# Patient Record
Sex: Female | Born: 1965
Health system: Southern US, Community
[De-identification: ages and names within clinical notes are randomized; demographics above are authoritative.]

## PROBLEM LIST (undated history)

## (undated) DIAGNOSIS — Z62810 Personal history of physical and sexual abuse in childhood: Secondary | ICD-10-CM

## (undated) DIAGNOSIS — Z5189 Encounter for other specified aftercare: Secondary | ICD-10-CM

## (undated) DIAGNOSIS — I10 Essential (primary) hypertension: Secondary | ICD-10-CM

## (undated) DIAGNOSIS — F909 Attention-deficit hyperactivity disorder, unspecified type: Secondary | ICD-10-CM

## (undated) DIAGNOSIS — A4902 Methicillin resistant Staphylococcus aureus infection, unspecified site: Secondary | ICD-10-CM

## (undated) DIAGNOSIS — N809 Endometriosis, unspecified: Secondary | ICD-10-CM

## (undated) DIAGNOSIS — M1990 Unspecified osteoarthritis, unspecified site: Secondary | ICD-10-CM

## (undated) DIAGNOSIS — R000 Tachycardia, unspecified: Secondary | ICD-10-CM

## (undated) DIAGNOSIS — F4310 Post-traumatic stress disorder, unspecified: Secondary | ICD-10-CM

## (undated) DIAGNOSIS — M329 Systemic lupus erythematosus, unspecified: Secondary | ICD-10-CM

## (undated) DIAGNOSIS — J439 Emphysema, unspecified: Secondary | ICD-10-CM

## (undated) DIAGNOSIS — R569 Unspecified convulsions: Secondary | ICD-10-CM

## (undated) DIAGNOSIS — I2510 Atherosclerotic heart disease of native coronary artery without angina pectoris: Secondary | ICD-10-CM

## (undated) DIAGNOSIS — A048 Other specified bacterial intestinal infections: Secondary | ICD-10-CM

## (undated) DIAGNOSIS — C5700 Malignant neoplasm of unspecified fallopian tube: Secondary | ICD-10-CM

## (undated) DIAGNOSIS — M069 Rheumatoid arthritis, unspecified: Secondary | ICD-10-CM

## (undated) HISTORY — DX: Atherosclerotic heart disease of native coronary artery without angina pectoris: I25.10

## (undated) HISTORY — DX: Emphysema, unspecified: J43.9

## (undated) HISTORY — DX: Personal history of physical and sexual abuse in childhood: Z62.810

## (undated) HISTORY — PX: HYSTEROSCOPY W/ ENDOMETRIAL ABLATION: SUR665

## (undated) HISTORY — PX: OTHER SURGICAL HISTORY: SHX169

## (undated) HISTORY — DX: Post-traumatic stress disorder, unspecified: F43.10

## (undated) HISTORY — DX: Other specified bacterial intestinal infections: A04.8

## (undated) HISTORY — DX: Tachycardia, unspecified: R00.0

## (undated) HISTORY — DX: Attention-deficit hyperactivity disorder, unspecified type: F90.9

## (undated) HISTORY — DX: Malignant neoplasm of unspecified fallopian tube: C57.00

## (undated) HISTORY — PX: COLONOSCOPY: SHX174

## (undated) HISTORY — PX: DILATION AND EVACUATION: SHX1459

## (undated) HISTORY — PX: LYMPHADENECTOMY: SHX15

---
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
## 1997-10-30 ENCOUNTER — Encounter: Payer: Self-pay | Admitting: Emergency Medicine

## 1997-10-30 ENCOUNTER — Inpatient Hospital Stay (HOSPITAL_COMMUNITY): Admission: EM | Admit: 1997-10-30 | Discharge: 1997-11-01 | Payer: Self-pay | Admitting: Emergency Medicine

## 1997-11-01 ENCOUNTER — Encounter: Payer: Self-pay | Admitting: Gastroenterology

## 1998-01-02 HISTORY — PX: AUGMENTATION MAMMAPLASTY: SUR837

## 1999-01-04 ENCOUNTER — Other Ambulatory Visit: Admission: RE | Admit: 1999-01-04 | Discharge: 1999-01-04 | Payer: Self-pay | Admitting: Obstetrics and Gynecology

## 1999-12-23 ENCOUNTER — Emergency Department (HOSPITAL_COMMUNITY): Admission: EM | Admit: 1999-12-23 | Discharge: 1999-12-23 | Payer: Self-pay | Admitting: Emergency Medicine

## 1999-12-24 ENCOUNTER — Encounter: Payer: Self-pay | Admitting: Emergency Medicine

## 2000-01-03 ENCOUNTER — Emergency Department (HOSPITAL_COMMUNITY): Admission: EM | Admit: 2000-01-03 | Discharge: 2000-01-03 | Payer: Self-pay | Admitting: Emergency Medicine

## 2000-01-04 ENCOUNTER — Inpatient Hospital Stay (HOSPITAL_COMMUNITY): Admission: RE | Admit: 2000-01-04 | Discharge: 2000-01-07 | Payer: Self-pay | Admitting: Family Medicine

## 2000-03-19 ENCOUNTER — Emergency Department (HOSPITAL_COMMUNITY): Admission: EM | Admit: 2000-03-19 | Discharge: 2000-03-19 | Payer: Self-pay | Admitting: Emergency Medicine

## 2001-08-19 ENCOUNTER — Emergency Department (HOSPITAL_COMMUNITY): Admission: EM | Admit: 2001-08-19 | Discharge: 2001-08-20 | Payer: Self-pay | Admitting: Emergency Medicine

## 2001-08-19 ENCOUNTER — Encounter: Payer: Self-pay | Admitting: Emergency Medicine

## 2001-09-04 ENCOUNTER — Emergency Department (HOSPITAL_COMMUNITY): Admission: EM | Admit: 2001-09-04 | Discharge: 2001-09-04 | Payer: Self-pay | Admitting: Emergency Medicine

## 2001-09-04 ENCOUNTER — Encounter: Payer: Self-pay | Admitting: Emergency Medicine

## 2001-10-09 ENCOUNTER — Emergency Department (HOSPITAL_COMMUNITY): Admission: EM | Admit: 2001-10-09 | Discharge: 2001-10-09 | Payer: Self-pay | Admitting: Emergency Medicine

## 2001-10-09 ENCOUNTER — Encounter: Payer: Self-pay | Admitting: Emergency Medicine

## 2002-08-07 ENCOUNTER — Encounter: Payer: Self-pay | Admitting: Emergency Medicine

## 2002-08-07 ENCOUNTER — Encounter: Payer: Self-pay | Admitting: Internal Medicine

## 2002-08-07 ENCOUNTER — Inpatient Hospital Stay (HOSPITAL_COMMUNITY): Admission: EM | Admit: 2002-08-07 | Discharge: 2002-08-09 | Payer: Self-pay | Admitting: Emergency Medicine

## 2002-11-27 ENCOUNTER — Emergency Department (HOSPITAL_COMMUNITY): Admission: EM | Admit: 2002-11-27 | Discharge: 2002-11-28 | Payer: Self-pay | Admitting: Emergency Medicine

## 2002-11-30 ENCOUNTER — Inpatient Hospital Stay (HOSPITAL_COMMUNITY): Admission: AD | Admit: 2002-11-30 | Discharge: 2002-11-30 | Payer: Self-pay | Admitting: Family Medicine

## 2002-12-02 ENCOUNTER — Encounter: Admission: RE | Admit: 2002-12-02 | Discharge: 2002-12-02 | Payer: Self-pay | Admitting: Obstetrics and Gynecology

## 2002-12-17 ENCOUNTER — Ambulatory Visit (HOSPITAL_COMMUNITY): Admission: RE | Admit: 2002-12-17 | Discharge: 2002-12-17 | Payer: Self-pay | Admitting: Obstetrics and Gynecology

## 2002-12-30 ENCOUNTER — Encounter: Admission: RE | Admit: 2002-12-30 | Discharge: 2002-12-30 | Payer: Self-pay | Admitting: Obstetrics and Gynecology

## 2003-01-03 HISTORY — PX: SALPINGECTOMY: SHX328

## 2003-01-05 ENCOUNTER — Encounter (INDEPENDENT_AMBULATORY_CARE_PROVIDER_SITE_OTHER): Payer: Self-pay | Admitting: Specialist

## 2003-01-05 ENCOUNTER — Inpatient Hospital Stay (HOSPITAL_COMMUNITY): Admission: RE | Admit: 2003-01-05 | Discharge: 2003-01-07 | Payer: Self-pay | Admitting: Obstetrics & Gynecology

## 2003-07-26 ENCOUNTER — Inpatient Hospital Stay (HOSPITAL_COMMUNITY): Admission: EM | Admit: 2003-07-26 | Discharge: 2003-08-01 | Payer: Self-pay | Admitting: Emergency Medicine

## 2003-07-28 ENCOUNTER — Encounter: Payer: Self-pay | Admitting: Cardiovascular Disease

## 2004-03-17 ENCOUNTER — Ambulatory Visit: Payer: Self-pay | Admitting: Obstetrics and Gynecology

## 2005-06-12 ENCOUNTER — Inpatient Hospital Stay (HOSPITAL_COMMUNITY): Admission: EM | Admit: 2005-06-12 | Discharge: 2005-06-16 | Payer: Self-pay | Admitting: Emergency Medicine

## 2006-06-22 ENCOUNTER — Emergency Department (HOSPITAL_COMMUNITY): Admission: EM | Admit: 2006-06-22 | Discharge: 2006-06-22 | Payer: Self-pay | Admitting: Emergency Medicine

## 2007-06-13 ENCOUNTER — Inpatient Hospital Stay (HOSPITAL_COMMUNITY): Admission: EM | Admit: 2007-06-13 | Discharge: 2007-06-17 | Payer: Self-pay | Admitting: Emergency Medicine

## 2007-06-14 ENCOUNTER — Encounter (INDEPENDENT_AMBULATORY_CARE_PROVIDER_SITE_OTHER): Payer: Self-pay | Admitting: Gastroenterology

## 2007-08-09 ENCOUNTER — Emergency Department (HOSPITAL_COMMUNITY): Admission: EM | Admit: 2007-08-09 | Discharge: 2007-08-10 | Payer: Self-pay | Admitting: Emergency Medicine

## 2007-08-18 ENCOUNTER — Inpatient Hospital Stay (HOSPITAL_COMMUNITY): Admission: EM | Admit: 2007-08-18 | Discharge: 2007-08-19 | Payer: Self-pay | Admitting: Emergency Medicine

## 2010-05-17 NOTE — Consult Note (Signed)
[name], KNACK NO.:  [id number]   MEDICAL RECORD NO.:  [id number]          PATIENT TYPE:  INP   LOCATION:  2630                         FACILITY:  [facility]   PHYSICIAN:  Antonietta Breach, M.D.  DATE OF BIRTH:  06/17/1965   DATE OF CONSULTATION:  06/17/2007  DATE OF DISCHARGE:                                 CONSULTATION   Mrs. Saltsman does not have a recollection for 3 days ago.  Since that  time, the history both acute and some of the past history has been  organized and recalled with the benefit of the patient's husband.   The patient's husband went home and reviewed all of the medication  supply and he confirms that it was impossible for the patient to have  taken more than a few extra pills.   The patient explains that she does recall taking a few extra pills in  order to help her relax and sleep, but she completely denies having any  suicidal intent.  Also, the history that is given by both the patient  and the husband confirms that the patient was having normal interests  and constructive goals all the way up to her acute admission.   The husband explained this is why he was so surprised to hear the report  that she had overdosed on medication.   While the patient requested and gave permission for her husband to be in  the room, the undersigned provided extensive discussion and education  with both the patient and her husband on treatment of the patient's  psychiatric condition.   The patient continues to be socially appropriate and cooperative.  She  describes constructive future goals and interests.  She is not having  any hallucinations or delusions.   PAST PSYCHIATRIC HISTORY UPDATE:  Please see the recent dictation.  The  patient does confirm that she has been taking a total of 8 mg per day of  either Atacand or Xanax.  She has been proceeding through the Atacand  detox protocol well in the hospital.  Both she and her husband concur  that it  would be best for her to have a reduction of the benzodiazepines  that she is using.   PAST PSYCHIATRIC HISTORY:  The patient states that she was recently  placed on Zoloft 100, the dosage was increased to 150 mg daily.   REVIEW OF SYSTEMS:  RHEUMATOLOGIC:  The patient explains that she had  lupus for years and has arthritic pain.   VITAL SIGNS:  Temperature 98.9, pulse 91, respiratory rate 14, blood  pressure 120/87, O2 saturation on room air 100%.   MENTAL STATUS EXAM:  Mrs. Bruns is alert.  She is oriented to all  spheres.  Her concentration is normal.  Her memory is intact for  immediate, recent and remote except for the 2 days, one being the day  prior to admission and the second day being the day after admission.  Her fund of knowledge and intelligence are within normal limits.  Speech  involves normal rate and prosody.  Thought process is logical, coherent,  goal-directed.  No looseness of associations.  Thought content, no  thoughts of harming herself, no thoughts of harming others no delusions,  no hallucinations.  Affect is broad and appropriate.  Mood is within  normal limits.  Insight is intact.  Judgment is intact.   ASSESSMENT:  1. 293.83 mood disorder not otherwise specified.  2. 293.84 anxiety disorder not otherwise specified.   The patient is no longer at risk to harm herself or others.  She agrees  to call emergency services immediately for any thoughts of harming  herself, thoughts of harming others or distress.   She declines admission to a psychiatric hospital and is no longer  committable after recovering from her acute mental status changes.   She wants to go to an intensive outpatient psychiatric program and is  psychiatrically cleared to do so.  The indications, alternatives and  adverse effects of her psychotropic medications were reviewed.   She understands the risk of over-using controlled substances and has a  new resolved to use less controlled  substances including medication for  pain.  She realizes that if she utilizes the controlled substances too  much that she will create a mental condition from them that can be worse  than the pain condition itself.   RECOMMENDATIONS:  Would ask the social worker to set this patient up  with an intensive outpatient psychiatric program to start immediately  after discharge.  Programs are available at [facility], [facility], [place], [place] and another [facility].   The patient will continue on her Topamax 25 mg b.i.d. she will restart  her Zoloft at 50 mg daily and then titrate to 150 mg daily over the next  week.   Regarding benzodiazepines, she will utilize only Xanax and will take 1  mg one half to one p.o. t.i.d. p.r.n. acute anxiety. she agrees to not  drive if drowsy.   The undersigned also recommended that she undergo a force of cognitive  behavioral therapy with deep breathing and progressive muscle  relaxation.  This will allow her, over the long-term, to reduce and  potentially eliminate her need for benzodiazepine therapy, particularly  if she stays on the Zoloft for 16 weeks.   The patient is continuing on Topamax as her primary mood stabilizer.  It  appears that she does have some form of bipolar disorder; however, as  mentioned above, the diagnosis 293.83 is utilized because of the general  medical factors involved in her decreased energy.   TOTAL TIME SPENT:  35 minutes.      Antonietta Breach, M.D.  Electronically Signed     JW/MEDQ  D:  06/17/2007  T:  06/17/2007  Job:  [id number]

## 2010-05-17 NOTE — Consult Note (Signed)
NAMELAKARA, WEILAND                 ACCOUNT NO.:  [id number]   MEDICAL RECORD NO.:  [id number]          PATIENT TYPE:  INP   LOCATION:  2630                         FACILITY:  [facility]   PHYSICIAN:  Antonietta Breach, M.D.  DATE OF BIRTH:  [birth year]   DATE OF CONSULTATION:  06/14/2007  DATE OF DISCHARGE:                                 CONSULTATION   REQUESTING PHYSICIAN:  [name], M.D.   REASON FOR CONSULTATION:  Suicide attempt with depression.   HISTORY OF PRESENT ILLNESS:  Ms. Cowles is a 45 year old female admitted  to the [facility] on June 13, 2007 due to a suicide attempt with  overdose.   The patient has been experiencing depressed mood, anhedonia, poor  concentration for several weeks.  She has experienced the loss of  several family members and severe grief.  She acknowledges suicidal  thoughts with an intention to overdose and did overdose with two bottles  of meclizine.   PAST PSYCHIATRIC HISTORY:  The patient has a diagnosis of bipolar  disorder and has been treated with the mood stabilizer regimen of  Topamax at 25 mg b.i.d.  She also has a history of severe anxiety and  has been taking Xanax approximately 6 mg a day.  She reported to the  admitting physician that she takes 8 mg of Ativan to sleep at night.  She is asking the undersigned for 3 mg of Ativan for insomnia.   The patient does have a history of prior suicide attempt.  Also, the  family reported that the patient sometimes mixes her medications with  alcohol.   FAMILY PSYCHIATRIC HISTORY:  None known.   SOCIAL HISTORY:  The patient has been suffering grief.  She has lost a  number of family members over the past 2 years.  She had a positive  urine drug screen for benzodiazepines.  She had negative alcohol and  tricyclic level.  The patient is married.   PAST MEDICAL HISTORY:  1. History of lupus.  2. Rheumatoid arthritis.  3. Migraines.   MEDICATIONS:  MAR is reviewed.   REVIEW OF  SYSTEMS:  Noncontributory.   MENTAL STATUS EXAM:  Ms. Chandonnet is alert.  She is oriented to all  spheres.  Speech normal.  Affect constricted with tears.  Mood  depressed.  Thought content as in the history of present illness.  Thought process is coherent.  Judgment is impaired.  Insight is poor  except for the patient's awareness that she needs treatment for her  depression.   The patient's memory function is intact.   ASSESSMENT:  AXIS I:  296.80 bipolar disorder not otherwise specified,  depressed.  Sedative hypnotic dependence.  AXIS II:  Deferred.  AXIS III:  See past medical history.  AXIS IV:  Grief.  AXIS V:  30.   RECOMMENDATIONS:  1. Would continue the sitter for suicide precautions.  2. Would admit to a psychiatric ward as soon as possible.  3. Restart the patient's Topamax 25 mg b.i.d.  4. Discontinue Xanax and start the Atacand detox protocol.  5. Other psychotropic medications  deferred.      Antonietta Breach, M.D.  Electronically Signed     JW/MEDQ  D:  06/14/2007  T:  06/14/2007  Job:  [id number]

## 2010-05-17 NOTE — H&P (Signed)
[name], [name]                 ACCOUNT NO.:  [id number]   MEDICAL RECORD NO.:  [id number]          PATIENT TYPE:  EMS   LOCATION:  MAJO                         FACILITY:  [facility]   PHYSICIAN:  Hollice Espy, M.D.DATE OF BIRTH:  March 13, 1965   DATE OF ADMISSION:  06/13/2007  DATE OF DISCHARGE:                              HISTORY & PHYSICAL   PRIMARY CARE PHYSICIAN:  C. Duane Lope, M.D.   RHEUMATOLOGIST:  Demetria Pore. Coral Spikes, M.D.   CONSULTANTS:  Psychiatry.   CHIEF COMPLAINT:  Overdose.   HISTORY OF PRESENT ILLNESS:  The patient is 45 year old white female  with past medical history of lupus as well as anxiety disorder, who is  on a large high dose of Soma as well as anxiolytics, who the husband  reports that she started having some episodes of diarrhea this evening  around dinnertime and felt very weak and lightheaded.  She was in the  bathroom for some time, and at that time reported took her usual dose of  anxiety medicines to help her sleep, when in the middle of the night he  noted all of a sudden that she seemed to be gasping for air.  He became  concerned and had the patient brought into the emergency room.  In the  emergency room, the patient had labs drawn.  She was found to have  normal cardiac markers and normal D-dimer.  Her blood pressure was noted  to be on the low end, with a blood pressure of 82/51, with a heart rate  is 64.  She initially required 100% face mask, but this was able to be  weaned down, and she is currently saturating 99% on room air.  The  patient was given Narcan, which did little to wake her up, but she  started to improve on her own.  Her urine drug screen came back positive  for benzodiazepines, consistent with her anxiolytic medications, but  otherwise negative.  The rest of her labs were essentially unremarkable,  with the exception of a BUN of 24, a creatinine of 1.6, and a white  count of 11, with no shift.  Her alcohol and tricyclic  level on her labs  were unremarkable.  I suspect at this point then that the patient likely  had a benzodiazepine overdose, possibly accidental.  When I went to  speak after evaluating the patient, she was starting to wake up but was  still extremely drowsy and unable to tell me anything.  No review of  systems was able to be obtained.  Her brothers spoke with me outside the  room and told me they had some concerns of the patient not being  mentally fit.  They said that she has had a previous history of a  suicide attempt and that she does not take her medications consistently,  oftentimes taking more than the required dose.  They also said that  sometimes she mixes her medications with alcohol, although at this time  it was noted that her alcohol level was normal.  They had some concerns  about the patient's  safety, and because of the patient's inability to  tell me anything, we are going to treat her as a possible suicidal  attempt.   PAST MEDICAL HISTORY:  1. History of lupus.  2. Rheumatoid arthritis.  3. Migraines.  4. Anxiety.   MEDICATIONS:  1. Phenergan 12.5 mg p.o. 4 times a day p.r.n., which according to the      patient's notes she usually is taking 4 times a.  2. She is also on Topamax 2 times a day.  3. Lasix p.o. daily.  4. K-Dur 20 mg p.o. daily when taking Lasix.  5. She takes Xanax, I believe to be 1 mg 4 times a day scheduled.  6. She takes p.r.n., meclizine.  7. She takes Soma 350 mg, she looks to be taking 4 times a day, 1 the      morning, 1 at lunch, and then 2 at night.  8. She also takes lorazepam 8 mg at night for a sleeping disorder.  9. She has also a mention of her medications that she takes Soma 2 at      night, which is listed twice on her medicine sheet.  It is unclear      if she is taking another additional 2 more, or if this is the      previously mentioned dose.   She has reportedly no known drug allergies.   SOCIAL HISTORY:  I am unable to  get any kind of tobacco, alcohol, or  drug use history.   FAMILY HISTORY:  Noncontributory.   PHYSICAL EXAMINATION:  VITAL SIGNS ON ADMISSION:  Temperature 98, heart  rate 64, blood pressure 82/51, up to 92/63, respirations 17, up to 21,  O2 saturation now 99% on room air.  GENERAL:  The patient is somnolent.  She is unable to get any kind of  oriented status.  HEENT:  Normocephalic, atraumatic.  Her mucous membranes are slightly  dry.  Her pupils are slightly dilated, but respond to light.  HEART:  Regular rate and rhythm, S1, S2.  LUNGS:  Clear to auscultation bilaterally.  ABDOMEN:  Soft, nontender, nondistended.  Positive bowel sounds.  She  does have ongoing current diarrhea.  EXTREMITIES:  Show no clubbing, cyanosis, or edema.   LABORATORY WORK:  Salicylate level less than 4.  Tylenol level less than  10.  Sodium 136, potassium 4.4, chloride 106, bicarbonate 19.  The  patient has an anion gap of 11.  BUN 24, creatinine 1.6, glucose 90.  LFTs essentially unremarkable.  White count 11, with 86% neutrophils,  H&H 14 and 42, MCV of 99, platelet count 198.  Urine pregnancy test is  negative.  Urine drug screen is positive for benzodiazepines.  Alcohol  level less than 5.  Tricyclics:  None detected.  D-dimer is 0.26, in the  normal range.  CPK 71.6, MB 1.2, troponin-I less than 0.05.   ASSESSMENT AND PLAN:  1. Benzodiazepine overdose, ? intentional versus accidental, +/- acute      renal failure, leading to decreased clearance.  N.p.o., 24-hour      sitter. step-down unit, psych evaluation.  Needs long-term scaling      of her medications.  I am concerned about the possibility of the      patient's high doses of benzodiazepines.  I would recommended she      be discontinued from Phenergan altogether, taking p.r.n. Zofran for      nausea.  In addition, as far as her anxiolytics go,  she looks to be      on a high dose of Xanax 4 times a day, plus an additional 8 mg of       lorazepam at night.  Would recommend considering changing her      nightly dose of lorazepam to Ambien, and her daytime Xanax to be      rather than prescribed through her PCP, but perhaps through      psychiatry.  In addition, for her pain medications, she is high      dose of Soma.  I would consider the possibility of a pain      specialist, but again will defer to her PCP for these issues.  In      the meantime, because of her previous report of a suicidal attempt,      we have not able to determine whether that is what has occurred      now.  We will keep her on 24-hour suicidal precautions and ask      psychiatry to see.  2. Diarrhea.  Suspect a viral gastroenteritis.  We will continue to      monitor.  Confirm that she is not on any      antibiotics recently.  3. Acute renal failure secondary to diarrhea.  Treat with IV fluids.  4. History of lupus.  We will need to confirm her medications.  She is      currently not on steroids.Hollice Espy, M.D.  Electronically Signed     SKK/MEDQ  D:  06/13/2007  T:  06/13/2007  Job:  [id number]   cc:   C. Duane Lope, M.D.  Anselm Jungling, MD  Demetria Pore. Coral Spikes, M.D.

## 2010-05-17 NOTE — Consult Note (Signed)
[name], [name] NO.:  [id number]   MEDICAL RECORD NO.:  [id number]          PATIENT TYPE:  INP   LOCATION:  2630                         FACILITY:  [facility]   PHYSICIAN:  Antonietta Breach, M.D.  DATE OF BIRTH:  12/09/1965   DATE OF CONSULTATION:  06/14/2007  DATE OF DISCHARGE:                                 CONSULTATION   ADDENDUM:  This is an addendum to an inpatient consultation report.   REQUESTING PHYSICIAN:  [name], M.D.   ALLERGIES:  PENICILLIN, DARVOCET, PLAQUENIL, LATEX.   MEDICATIONS:  The patient is not on any current psychotropic  medications.  [facility] is reviewed.   OTHER REVIEW OF SYSTEMS:  Constitutional, head, eyes, ears, nose,  throat, mouth, neurologic, psychiatric, cardiovascular, respiratory,  gastrointestinal, genitourinary, skin, musculoskeletal, hematologic,  lymphatic, endocrine, metabolic all unremarkable.      Antonietta Breach, M.D.  Electronically Signed     JW/MEDQ  D:  06/17/2007  T:  06/17/2007  Job:  [id number]

## 2010-05-17 NOTE — Discharge Summary (Signed)
[name], [name]                 ACCOUNT NO.:  [id number]   MEDICAL RECORD NO.:  [id number]          PATIENT TYPE:  INP   LOCATION:  1442                         FACILITY:  [facility]   PHYSICIAN:  Corinna L. Lendell Caprice, MDDATE OF BIRTH:  01-Mar-1965   DATE OF ADMISSION:  08/18/2007  DATE OF DISCHARGE:  08/19/2007                               DISCHARGE SUMMARY   DISCHARGE DIAGNOSES:  1. Chest pain, myocardial infarction and pulmonary embolus ruled out      by serial cardiac enzymes and D-dimer.  2. History of cocaine use within the past week.  3. Reported history of lupus and/or rheumatoid arthritis, results      unavailable.  4. Chronic chest, abdominal and pelvic pain.  5. Irritable bowel syndrome.  6. Anxiety.  7. Bipolar disorder.  8. History of multiple intentional and unintentional medication      overdoses.  9. History of candidal esophagitis.  10.Anemia.   DISCHARGE MEDICATIONS:  1. Dilaudid 2 mg every 4 hours as needed for pain.  2. She is to continue Tylenol as needed.  3. Minocycline 100 mg daily.  4. Tresa Garter is supposed to be twice a day as needed.  5. Furosemide as needed.  6. Prenatal vitamin.  7. Klor-Con 20 mEq a day.  8. She is supposed to have stopped lorazepam, although she is still      taking this.  She is supposed to instead be on Xanax 0.5 to 1 mg      p.o. t.i.d. p.r.n. panic only.  9. Promethazine 25 mg every 6 hours as needed.  10.Zoloft 150 mg a day.  11.Topamax 25 mg a day.  12.Meclizine 25 mg as needed for dizziness.   She is encouraged to follow up with [name] in 3 weeks after abstaining  from cocaine and other drugs of abuse.  If she still has chest pain at  that time, she can be referred to cardiology as an outpatient.  She is  encouraged to follow up with rheumatologist of choice first available  appointment.  She has complement levels pending as well as an anti-  double-stranded DNA antibody.   CONDITION:  Stable.   ACTIVITY:  Ad-lib.   PROCEDURES:  None.   LABORATORY DATA:  CBC is unremarkable.  Serial cardiac enzymes normal.  In and out catheter urinalysis:  Negative for nitrites or leukocyte  esterase, small blood, 0 to 2 red cells.  Complete metabolic panel  normal.  D-dimer normal.  Urine pregnancy negative.  C3 low at 60, C4  low at 13.  Urine drug screen:  Positive for opiates and  benzodiazepines.  On August 7, her urine drug screen was positive for  benzodiazepines and cocaine.  Blood alcohol less than 5.  Blood alcohol  on August 7 was 101.   SPECIAL STUDIES/RADIOLOGY:  EKG shows normal sinus rhythm.  Chest x-ray  shows nothing acute.   HISTORY AND HOSPITAL COURSE:  [name] is a 45 year old white female  with a confusing medical history.  She presented with headache, neck  pain, chest pressure, left arm pain and kidney  pain.  She also had some  diaphoresis and nausea.  She was added admitted and ruled out for MI.  She has a strong family history of early heart disease.  Two of her  brothers have had stents in their 46s.  She does have a history of  chronic chest, abdomen and pelvic pain.  She carries the diagnosis of  rheumatoid arthritis and lupus, although I do not know how this was  diagnosed.  She has not seen Dr. Coral Spikes for many years, and she  reportedly has seizures on Plaquenil, so she is not on any medications  for her lupus.  She has a history of anxiety and bipolar disorder with  multiple intentional and unintentional drug overdoses.  She also was  seen in the emergency room a week ago for a drug overdose.  She at that  time had alcohol and cocaine in her system.  The patient has a history  of candidal esophagitis and gastric ulcers, and so she is not on any  anti-inflammatories.  However, she did get 2 doses of Toradol which  seemed to help her symptoms.  She also was requesting a lot of Dilaudid  and Phenergan, although she was tolerating her diet.  She did have some  chest wall tenderness,  but this did not reproduce the pain she was  experiencing.  The patient had a negative Cardiolite in 2005.  I  discussed the case with [facility] cardiology who referred me to the on-call  cardiologist, Dr. Donnie Aho.  He recommended that since the patient has had  cocaine within the past week that she abstain for several weeks and  follow up with her primary care physician.  If she is still having pain  at that time, she may benefit from an outpatient Cardiolite.  After  confronted with her previous positive urine drug screen, she did admit  to using cocaine over the past week, although she minimized it.  I also  recommended that she follow up with a rheumatologist of choice first  available appointment because some of her pain may be lupus related, but  again I do not have any information with regard to how she was  diagnosed, and so I have not placed her on prednisone or other.   Total time on the day of discharge is 50 minutes. .      Corinna L. Lendell Caprice, MD  Electronically Signed     CLS/MEDQ  D:  08/19/2007  T:  08/19/2007  Job:  [id number]   cc:   C. Duane Lope, M.D.  Fax: [phone]   Demetria Pore. Coral Spikes, M.D.  Fax: [phone]   Shirley Friar, MD  Fax: [phone]

## 2010-05-17 NOTE — H&P (Signed)
NAMEVEDANSHI, MASSARO                 ACCOUNT NO.:  [id number]   MEDICAL RECORD NO.:  [id number]          PATIENT TYPE:  INP   LOCATION:  1442                         FACILITY:  [facility]   PHYSICIAN:  Corinna L. Lendell Caprice, MDDATE OF BIRTH:  [birth year]   DATE OF ADMISSION:  08/18/2007  DATE OF DISCHARGE:                              HISTORY & PHYSICAL   CHIEF COMPLAINT:  Chest pain.   HISTORY OF PRESENT ILLNESS:  Ms. [name] is a 45 year old white female  with multiple medical problems and psychiatric problems who presents  with left-sided chest pain that radiates to her left arm on and off for  the past several days.  It woke her up from sleep tonight.  She  sometimes describes it as sharp, sometimes pressure-like.  She says it  is worse if she lies on her left side.  She has received morphine and  Dilaudid, as well as Phenergan, and is difficult to get any history from  currently.  Much of the history is from old records and from the  husband.  She also had some nausea and diaphoresis.  She has a history  of anxiety and benzodiazepine dependence and misuse requiring  hospitalizations for overdoses in the past.  She had a Cardiolite in  2005 which showed no ischemia.  She has several brothers who have had  coronary artery disease in their 89s.  She does not smoke.  She was  recently in the emergency room with a urine drug screen positive for not  only benzodiazepines but also cocaine.  She also carries the diagnosis  of lupus but had a negative ANA and anti double-stranded DNA antibody  several years ago with normal compliment levels.  She reports that Dr.  Coral Spikes is her rheumatologist, although she has not seen him for a long  time.  She is also complaining of neck pain, headache arm pain, kidney  pain.   PAST MEDICAL HISTORY:  1. Benzodiazepine overdose.  2. Bipolar disorder.  3. Reported history of lupus and rheumatoid arthritis.  4. History of migraines.  5. Probable Candida  esophagitis.  6. Irritable bowel syndrome.   MEDICATIONS:  1. Tylenol 1 tablet every 4 hours.  2. Minocycline 100 mg p.o. b.i.d.  3. Soma 350 mg four times a day as needed.  4. Furosemide 40 mg a day.  5. Prenatal vitamins daily.  6. KCl 20 mEq a day.  7. She reports that she is still taking lorazepam, although her last      discharge summary in July recommends that she not take lorazepam      and instead take Xanax 0.5 to 1 mg p.o. t.i.d. as needed only for      panic.  8. Phenergan 25 mg as needed.  9. Zoloft 150 mg daily.  10.Topamax 25 mg twice a day.   SOCIAL HISTORY:  The patient is married and here with her husband.  She  does drink alcohol.  She reportedly does not smoke.  She previously  denied using recreational drugs, but she did have a urine drug screen  positive for  cocaine.   FAMILY HISTORY:  Her brothers have a history of coronary artery disease  in their 33s.   PAST SURGICAL HISTORY:  1. Breast augmentation.  2. Diagnostic laparoscopy with lysis of adhesions and left      salpingectomy for left hydrosalpinx.   REVIEW OF SYSTEMS:  As above; otherwise negative.   PHYSICAL EXAMINATION:  VITAL SIGNS:  Temperature is 98, blood pressure  123/84, pulse 79, respiratory rate 18, oxygen saturation 94% on room  air.  GENERAL:  The patient is a somnolent white female who is speaking very  quietly and difficult to understand.  HEENT:  Pupils equal, round, reactive to light.  Moist mucous membranes.  NECK:  Supple.  She does have some left-sided neck spasm.  LUNGS:  Clear to auscultation bilaterally without wheezes, rhonchi or  rales.  CARDIOVASCULAR:  Regular rate and rhythm without murmurs, gallops or  rubs.  CHEST:  She does have some chest wall tenderness, but she reports this  is different than the pain she has been experiencing.  UPPER EXTREMITIES:  She has full range of motion in her joints of her  left arm with no tenderness.  ABDOMEN:  Soft, nontender,  nondistended.  GU/RECTAL:  Deferred.  EXTREMITIES:  No clubbing, cyanosis or edema.  No calf tenderness.  NEUROLOGIC:  The patient is sleepy but oriented.  Cranial nerves and  sensorimotor exam are grossly intact.  SKIN:  No rash.  PSYCHIATRIC:  Poor eye contact, flat affect, sleepy.   LABORATORY DATA:  CBC unremarkable.  INR is normal.  Complete metabolic  panel normal.  Two sets of point care enzymes negative.  EKG shows  normal sinus rhythm.  Chest x-ray shows nothing acute.   ASSESSMENT/PLAN:  1. Chest pain, atypical.  The patient will get Toradol and Dilaudid as      needed.  Consider cardiac etiology.  Will give aspirin and cycle      cardiac enzymes.  Also consider pulmonary embolus.  I will check a      D-dimer.  Also consider pleurisy or lupus-related symptoms.  2. Reported history of lupus and rheumatoid arthritis.  I will check      an anti double stranded DNA antibody and compliment levels.  3. Bipolar disorder with history of benzodiazepine misuse.  I will      just give Xanax for now.  4. Irritable bowel syndrome.  5. History of chronic abdominal and chest pain.  6. Anxiety.  7. Recent cocaine use based on urine drug screen.      Corinna L. Lendell Caprice, MD  Electronically Signed     CLS/MEDQ  D:  08/18/2007  T:  08/18/2007  Job:  [id number]   cc:   C. Duane Lope, M.D.  Fax: [phone]   Demetria Pore. Coral Spikes, M.D.  Fax: [phone]   Shirley Friar, MD  Fax: [phone]

## 2010-05-17 NOTE — Consult Note (Signed)
NAMENICOLASA, MILBRATH                 ACCOUNT NO.:  [id number]   MEDICAL RECORD NO.:  [id number]          PATIENT TYPE:  INP   LOCATION:  2630                         FACILITY:  [facility]   PHYSICIAN:  Shirley Friar, MDDATE OF BIRTH:  1965/05/18   DATE OF CONSULTATION:  06/14/2007  DATE OF DISCHARGE:                                 CONSULTATION   REFERRING PHYSICIAN:  [name], M.D.   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  She overdosed  on multiple prescription medications last evening and was brought to the  emergency room after reported respiratory arrest at home.  The patient  reports multiple black stools and diarrhea yesterday, none prior to  that.  She has no heartburn, indigestion, abdominal pain, or anorexia.  She is very anxious, concerned about possibly having to go through a  psychiatric admission in the coming days.  The patient reports that she  has had no food today, but there are multiple bags of chips and pretzels  in the room.  She does have a history of rectal bleeding once in 2002  and again in 2007.  In 2007 she had a colonoscopy by Dr. Herbert Moors  that was normal to the cecum.   PAST MEDICAL HISTORY:  Significant for lupus, rheumatoid arthritis,  migraines, anxiety, depression, chronic fatigue syndrome, panic attacks,  and PTSD.  She has had a history of endometriosis, arthritis and, as I  mentioned, a history of GI bleed in 2002 and 2007.   SURGERIES:  1. Appendectomy.  2. Breast augmentation.  3. Fallopian-tube removal.  4. Laparoscopic lysis of adhesions.   ALLERGIES:  1. PENICILLIN.  2. DARVOCET.  3. PLAQUENIL.  4. LATEX.   MEDICATIONS:  1. Phenergan.  2. Topamax.  3. Lasix.  4. Klor-Con.  5. Alprazolam.  6. Lorazepam.  7. Meclizine.  8. Carisoprodol.   LABORATORIES:  Her hemoglobin is 10.6 and stable.  She was brought in  with a hemoglobin  of approximately 11.  Her white count is 7.8,  platelets 140,000, hematocrit 31.  Her BMET is within normal limits  other than a glucose of 134.  Her BUN yesterday was 24, creatinine 1.58,  but that seems to have come down with hydration.  She has guaiac-  positive stools.   ASSESSMENT:  Dr. Charlott Rakes has seen and examined the patient,  collected history, and reviewed her chart.  He states this is a 45-year-  old female admitted after overdose, currently having black stools and  anemia.  Daily Mobic has probably induced gastritis or possibly an  ulcer.  We will plan for an upper esophagogastroduodenoscopy today and  proton pump inhibitor therapy.   Thanks very much for this consultation.      Stephani Police, [place]      Shirley Friar, MD  Electronically Signed    MLY/MEDQ  D:  06/14/2007  T:  06/15/2007  Job:  [id number]   cc:   Shirley Friar, MD

## 2010-05-17 NOTE — Op Note (Signed)
[name], [name]                 ACCOUNT NO.:  [id number]   MEDICAL RECORD NO.:  [id number]          PATIENT TYPE:  INP   LOCATION:  2630                         FACILITY:  [facility]   PHYSICIAN:  Shirley Friar, MDDATE OF BIRTH:  [birth year]   DATE OF PROCEDURE:  DATE OF DISCHARGE:                               OPERATIVE REPORT   INDICATION:  Gastrointestinal bleed (black-colored diarrhea x1 day prior  to admission).   MEDICINES:  1. Phenergan 25 mg IV.  2. Fentanyl 100 mcg IV.  3. Versed 10 mg IV.   FINDINGS:  Endoscope was inserted through the oropharynx and esophagus  intubated, where there were some scattered white raised plaques in the  mid esophagus extending down close to the GE junction.  Endoscope was  advanced through this area into the stomach where a large amount of  solid food particles were seen in the body and fundus of the stomach  which could not be irrigated or washed away.  Retroflexion was done  which revealed normal cardia, but the fundus was partially obscured by  the food particles.  Endoscope was straightened and advanced down to the  distal portion of the stomach where 2 clean-based ulcers were seen that  were shallow and one linear ulcer was noted which was clean based.  The  distal stomach was also edematous and friable.  Endoscope was advanced  through this area into the duodenal bulb, which revealed some scattered  exudate but no ulcer was seen.  Endoscope was then advanced to the  second portion of duodenum which was diffusely flattened and eroded, and  biopsies were taken of this part of the duodenum.  Endoscope was  withdrawn back into the stomach, and a biopsy was taken of the distal  stomach.  Endoscope was then withdrawn into the esophagus, and 2  biopsies were taken of the esophageal mucosa.   ASSESSMENT:  1. Atrophic-appearing duodenal mucosa, status post biopsies, rule out      celiac sprue.  2. Distal clean-based gastric ulcers, status  post biopsy of      surrounding mucosa to check for Helicobacter pylori.  3. White plaques in esophagus concerning for candida esophagitis,      status post biopsies.   PLAN:  1. Follow-up on pathology.  2. Start Diflucan empirically.  3. May need repeat upper endoscopy in the future if bleeding recurs      since fundus and body not completely visualized due to food      particles.  This may be possible to be done as an outpatient if she      does well, with no further bleeding, since she will need to have      her gastric ulcers reevaluated in 6 to 8 weeks to make sure they      have healed.     Shirley Friar, MD  Electronically Signed    VCS/MEDQ  D:  06/14/2007  T:  06/15/2007  Job:  [id number]

## 2010-05-20 NOTE — Consult Note (Signed)
NAME:  [name], WANT NO.:  [id number]   MEDICAL RECORD NO.:  [id number]          PATIENT TYPE:  INP   LOCATION:  1832                         FACILITY:  [facility]   PHYSICIAN:  Graylin Shiver, M.D.   DATE OF BIRTH:  July 17, 1965   DATE OF CONSULTATION:  06/12/2005  DATE OF DISCHARGE:                                   CONSULTATION   REASON FOR CONSULTATION:  The patient is a 45 year old female who presents  to the emergency room with complaints of rectal bleeding for the past 4  days.  She states she has been passing bright red blood per rectum about  every 3 hours.  Also, she is complaining of lower abdominal pain.  She  states that she had this similar problem about 5 years ago and Dr. Ewing Schlein did  a colonoscopy and found some bleeding area in the colon.  She is no more  specific than that.  I do not see that report on the E chart.   PAST HISTORY:   ALLERGIES:  1.  PLAQUENIL.  2.  DARVOCET.  3.  CODEINE.  4.  PENICILLIN.   MEDICAL PROBLEMS:  1.  History of tachycardia in the past.  2.  Seizures.  3.  Lupus.  4.  Fibromyalgia.  5.  Depression.  6.  Anxiety.  7.  Panic attacks.  8.  Endometriosis.   SURGERIES:  1.  Appendectomy.  2.  Fallopian tube removal.   MEDICATIONS:  1.  Allegra.  2.  Diclofenac sodium.  3.  Flexeril.  4.  Flonase.  5.  Lasix.  6.  Meclizine.  7.  Phenergan.  8.  Prednisone.  9.  Prenatal vitamins.  10. Soma.  11. Toprol XL.  12. Tramadol-acetaminophen.  13. Tylenol.  14. Vicodin.  15. Wellbutrin.   PHYSICAL EXAMINATION:  GENERAL:  She does not appear in any acute distress,  nonicteric.  NECK:  Supple.  HEART:  Regular rhythm.  LUNGS:  Clear.  ABDOMEN:  Bowel sounds normal, soft.  Minimal tenderness in lower abdomen  but no rebound, guarding, or hepatosplenomegaly.  Bed pan noted at the side  of the bed shows a small amount of bright red liquidy stool.  It is very  watery in appearance.   IMPRESSION:  Lower  gastrointestinal bleeding, etiology unclear   PLAN:  Admit to hospital.  Observe.  Check H&Hs.  Will plan colonoscopy in a  couple of days.  I offered it to her tomorrow, but she does not wish to prep  tonight, plus she ate something today.           ______________________________  Graylin Shiver, M.D.     SFG/MEDQ  D:  06/12/2005  T:  06/13/2005  Job:  [id number]   cc:   C. Duane Lope, M.D.  Fax: [phone]

## 2010-05-20 NOTE — Group Therapy Note (Signed)
[name], LAWLER NO.:  [id number]   MEDICAL RECORD NO.:  [id number]          PATIENT TYPE:  WOC   LOCATION:  [facility]                   FACILITY:  [facility]   PHYSICIAN:  Argentina Donovan, MD        DATE OF BIRTH:  1965/06/01   DATE OF SERVICE:  03/17/2004                                    CLINIC NOTE   REASON FOR VISIT:  The patient is a 45 year old white female, 5 feet 6  inches weighing 167 pounds, gravida 1 para 1-0-0-1 with a child 90 years of  age, who has multiple and sundry number of medical problems including lupus  erythematosus and rheumatoid arthritis. Also been told that she has  fibromyalgia and many other significant previous complaints, with a history  of tachycardia, history of depression, anxiety disorder, and has always been  complaining of pelvic pain. She had a laparoscopy done in January a year ago  at which time some pelvic adhesions and a salpingectomy was undertaken.  Other than that, the pelvis looked completely normal according to the  surgeons who did this procedure. The patient continues to complain of  continual pelvic pain with increasing dysmenorrhea starting days before her  period and an increase in the amount of bleeding and menorrhagia, especially  the first 2 days. She seems convinced that if everything was removed as her  mother had that the pain certainly with the periods at least would go away,  and is asking for this in view of a lack of evidence of significant  pathology. In a long discussion with the patient and her husband, who has  the same desire, we have talked about complications and the possibility of  the pain being worse after surgery, but they really desire to have the  uterus and ovaries removed. We have told her that we want her to go visit  the pain clinic, and if they concur that they think that the pain that she  has will be significantly reduced by gynecological surgery and removing her  uterus and ovaries  that we will undertake this surgery for her. She has  agreed to do that, and if she calls we will go ahead and schedule her for  hysterectomy and bilateral salpingo-oophorectomy, try to do it vaginally; if  not, doing it abdominally.   IMPRESSION:  1.  Chronic pelvic pain.  2.  Chronic sundry other maladies.      PR/MEDQ  D:  03/17/2004  T:  03/18/2004  Job:  [id number]

## 2010-05-20 NOTE — H&P (Signed)
[name], [name]                           ACCOUNT NO.:  [id number]   MEDICAL RECORD NO.:  [id number]                   PATIENT TYPE:  INP   LOCATION:  3315                                 FACILITY:  [facility]   PHYSICIAN:  Jackie Plum, M.D.             DATE OF BIRTH:  [birth year]   DATE OF ADMISSION:  08/07/2002  DATE OF DISCHARGE:                                HISTORY & PHYSICAL   ADMISSION DIAGNOSES:  1. Respiratory failure secondary to multiple drug overdose.  2. Multiple drug overdose with possible suicide gesture.  3. Tylenol overdose, rule out toxicity.  4. History of chronic __________  5. History of depression.  6. History of anxiety.  7. History of polyarthritis.  8. History of systemic lupus erythematosus, possibly.  9. History of fibromyalgia and chronic fatigue.  10.      History of panic attacks.  11.      History of __________ dependence.  12.      Questionable history of seizure disorder.  13.      Status post breast augmentation.  14.      Status post appendectomy.  15.      History of rectal bleeding and abdominal pain in 1999 with negative     colonoscopy.  16.      History of allergies to codeine, Darvocet, and penicillin and     intolerance to Plaquenil and prednisone.   HISTORY OF PRESENT ILLNESS:  The patient is a 45 year old Caucasian lady who  was brought to the ED around 11:30 p.m. last night after ingestion of  multiple drugs.  According to the patient, she took about 15 tablets of  Phenergan, 7 tablets of Valium, and about 10-20 tablets of Ativan around  11:30 p.m. last night.  There has been some turmoil in the family with a  disagreement with her son and that might be the reason why she took those  medications.  She was said to be depressed, tearful, and had some suicidal  thoughts and gesture at the time of evaluation at the ED around 1 a.m. this  morning.  She admits to a history of nausea, but denies any shortness of  breath, cough,  chest pain, fever, chills, headache, visual changes,  abdominal pain, vomiting, diarrhea, dysuria, or frequency of micturition.  At the emergency room, normal saline was started.  The ACT team was  consulted for admission to the [facility].  However, we were  called to see the patient for admission on account of decrement in her O2  saturation as low as the low 80s with a respiratory rate of 7 while she was  off oxygen.  I came to see the patient around 8 a.m. this morning.  She is  on oxygen and her respiratory rate is around 18 per minute.  O2 saturation  was around 98% on 2 L of oxygen.   PAST  MEDICAL HISTORY:  Please see the problem list above.   MEDICATIONS:  The patient is on multiple medicines at home.  These include:  Soma, Xanax, Wellbutrin, Vioxx, meclozine, Flexeril, prednisone, and  Phenergan.  The doses of these medications are not very clear at this  moment.   SOCIAL HISTORY:  She is a Associate Professor.  She is married with one older son.  She does not smoke cigarettes.  She uses alcohol occasionally.  Denies any  illicit drug use.   REVIEW OF SYSTEMS:  As in HPI.   PHYSICAL EXAMINATION:  VITAL SIGNS:  The blood pressure at 7:50 a.m. was  noted to be 114/59.  Pulse rate 80.  The respiratory rate was said to be 13  per minute, but during this admission respiratory rate is noted in the HPI.  O2 saturation 98% on room air.  Temperature 98.1 degrees Fahrenheit.  GENERAL APPEARANCE:  She was not in acute cardiopulmonary distress.  She was  tearful.  HEENT:  Normocephalic and atraumatic.  The pupils were equal, round, and  reactive to light.  The oropharynx was moist without any exudates or  erythema.  NECK:  Supple.  No JVD.  No thyromegaly.  LUNGS:  Unremarkable.  CARDIAC:  Regular rate and rhythm without any murmur.  ABDOMEN:  Soft.  Bowel sounds were present.  There were normoactive.  EXTREMITIES:  No edema.  No cyanosis.  Color was normal with warm, dry  skin.   LABORATORY DATA:  LFTs without BMET indicate total bilirubin 0.4, alkaline  phosphatase 45, SGOT 17, SGPT 15, total protein 6.9, and albumin 4.1.  The  acetaminophen level was 115.0.  __________ less than 4.  Drug screen  positive for benzodiazepines and opiates.   ASSESSMENT:  This 45 year old lady with multiple medical problems, including  psychiatric problems, presented with multiple drug ingestion.  This was a  possible suicide gesture (the patient denies suicide attempt during my  evaluation, however, apparently had admitted to this during the EDP  evaluation.  Please see the EDP notes). The patient developed hypoxic  respiratory failure secondary to Synthroid depression from her ingested  medications.   PLAN:  The plan of care is to admit the patient to a stepdown bed for 23-  hour observation.  She is hemodynamically stable.  Her O2 saturations on 2 L  have been running between 98-100%.  Her respiratory rate has been around 13-  18 per minute.  The ACT team has been consulted.  The patient will be seen  by the psychiatric team later on tomorrow and may be cleared for that  psychiatric appointment at this point.  She will receive supportive care,  including antiemetics and IV fluids.  We are also going to monitor her  acetaminophen level.  Her acetaminophen level at four hours was less than  150.  We will check the level at 12 hours and if it is more than 50, then  she will be managed appropriately.                                                Jackie Plum, M.D.    GO/MEDQ  D:  08/07/2002  T:  08/07/2002  Job:  [id number]

## 2010-05-20 NOTE — Group Therapy Note (Signed)
NAME:  [name], [name]                           ACCOUNT NO.:  [id number]   MEDICAL RECORD NO.:  [id number]                   PATIENT TYPE:  OUT   LOCATION:  [facility]                           FACILITY:  [facility]   PHYSICIAN:  Elsie Lincoln, MD                   DATE OF BIRTH:  06-04-1965   DATE OF SERVICE:                                    CLINIC NOTE   CLINIC NOTE:  This is a 45 year old, para 1-0-7-1, LMP November 25, 2002,  who presents for followup from [facility] and the [facility].  The patient  states that for the past 3-4 months during her menses, she has had severe  abdominal pain that has been debilitating.  She was seen, November 27, 2002,  in the hospital and discharged, and seen again on the 28th and discharged  with narcotics.  The patient states that the pain is still present now,  although her menses is over.  Patient states her menstrual cycle has been  regular; however, the initial cycle is shortened to every 21 days.  She has  never been diagnosed with endometriosis in the past.  The patient has  multiple, multiple medical problems.  Her medication list is up to 25  medications.   MOST PERTINENT MEDICAL PROBLEMS:  1. 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  2. Irritable bowel syndrome.  3. Fibromyalgia.  4. Chronic fatigue syndrome.  5. Depression/anxiety.  6. Questionable history of suicide attempt from overdose of narcotics.   PAST SURGICAL HISTORY:  1. Appendectomy as a young child.  2. Supposedly had a cyst removed at this time from her ovary.   GYNECOLOGIC HISTORY:  1. NSV x 78 as a 45 year old.  2. One TOP and six SABs in the past eight years.  3. No history of sexually transmitted diseases.  4. Ovarian cyst at age 79.  5. No fibroids and no abnormal Pap smears; however, her last Pap smear was 2-     4 years ago.   SOCIAL HISTORY:  Depression and anxiety.  Married 14 years to a husband who  is present in the room.   REVIEW OF SYMPTOMS:   Positive nausea, vomiting, takes Phenergan for this.  Diarrhea that is controlled with Zelnorm.  No night sweats.  No chest pain,  shortness of breath.  Patient had problem urinating until she decreased her  oxybutynin to one time a day, and now she is able to urinate as usual.  Her  family practice doctor put her on this medication secondary to nocturia.   PHYSICAL EXAMINATION:  VITAL SIGNS:  Today temperature 97.3, pulse 102,  blood pressure 132/87, weight 154.6.  ABDOMEN:  Soft, mildly tender in the lower quadrants, nondistended.  No  rebound, no guarding.  PELVIC:  External genitalia:  Tanner V.  No lesions on the outside.  Vagina  pink, normal rugae, no discharge.  Cervix:  No  lesions.  No CMT.  Uterus:  Small, anteverted, mildly tender.  Left ovary:  Mildly tender, not enlarged.  Right ovary:  Mildly tender, not enlarged.  There is no focal point of pain.  No point of the exam hurts more than any other.   ASSESSMENT/PLAN:  A 45 year old para 1-0-7-1 with chronic pelvic pain.   PLAN:  1. Followup ultrasound from CT that was done during her ER visit, there were     several small cysts in the left ovary.  We will make sure these are not     growing or ruptured.  2. Check FSH, TSH, prolactin, question whether the patient is going into     menopause, cycle shortening.  3. Followup with gastroenterologist.  4. Irritable bowel syndrome and recent blood found in the stools by ER.  5. Pap smear done today.  6. Cultures and wet prep were already negative from earlier this week.  7. Questionable endometriosis.  We will consider scheduling for diagnostic     laparoscopy.                                               Elsie Lincoln, MD    KL/MEDQ  D:  12/02/2002  T:  12/02/2002  Job:  [id number]

## 2010-05-20 NOTE — Discharge Summary (Signed)
[name], [name]                           ACCOUNT NO.:  [id number]   MEDICAL RECORD NO.:  [id number]                   PATIENT TYPE:  INP   LOCATION:  3315                                 FACILITY:  [facility]   PHYSICIAN:  Jackie Plum, M.D.             DATE OF BIRTH:  1965-02-17   DATE OF ADMISSION:  08/07/2002  DATE OF DISCHARGE:  08/08/2002                                 DISCHARGE SUMMARY   DISCHARGE DIAGNOSES:  1. Hypoxic respiratory failure secondary to central depression by     medications - resolved.  2. Multiple drug overdose with suicide gesture.  3. History of depression, anxiety, polyarthritis, systemic lupus     erythematosus, fibromyalgia and chronic fatigue.  4. History of panic attacks.  5. History of benzodiazepine dependence.  6. History of questionable gastrointestinal disorder.   DISCHARGE MEDICATIONS:  The patient is to resume her preadmission  medications at home including Soma, Xanax, Wellbutrin, Vioxx, meclozine  p.r.n., Flexeril, prednisone and Phenergan.   DISCHARGE LABORATORY DATA:  INR 1.1.  Hemoglobin 14.  Sodium 133, potassium  3.6, chloride 101, CO2 25, glucose 115, BUN 8, creatinine 0.8.  Total  bilirubin 0.4, alkaline phosphatase 45, SGOT 17, SGPT 15, total protein 6.9.  Albumin 4.1.  Acetaminophen level less than 10.  ACT team saw the patient  yesterday.  Dr. [name] is ________ the patient today.   CONDITION ON DISCHARGE:  Satisfactory and improved.   REASON FOR ADMISSION:  The patient came to the hospital after ingestion  after multiple doses of Phenergan, Vicodin and Ativan.  She was in the ER  less than an hour after ingestion of these medications.  While in the ER,  the patient was seen by the ACT team for possible transfer to [facility], however, we were called to admit the patient on account of  observation of her O2 saturations going down to the low 80s.   ADMISSION PHYSICAL EXAMINATION:  The patient was  slightly drowsy from the  medications, however, she was very easily arousable.  Her pulmonary and cardiac examinations were all unremarkable.  Her  acetaminophen level after about  four hours positive ingestion of these  medications was 115.  The patient's O2 saturation was 98% on two liters.   HOSPITAL COURSE:  The patient was admitted to the Hospitalist service.  She  was admitted to the stepdown bed for monitoring.  She received supportive  care including oxygen and antiemetics were added to supplementation.  This  morning, the patient is fully alert and oriented x3.  Her O2 saturation on  room air has been 98% to 100%.  Her lung examination was unremarkable.  She  had fascicular breath sounds with adequate air entry.  Her cardiac  examination was regular without any gallops or murmur.  She is not  clinically dehydrated.  Her vital signs are stable with heart rate of about  96 per minute.  Her repeat acetaminophen level  at 12 hours was 43.7  yesterday, which was consistent with Tylenol overdose without acute  toxicity.  Her Tylenol level this morning is less than 10.  On account of  history of suicide gesture, the patient is being discharged from acute  hospital today after evaluation by Dr. [name].    DISPOSITION:  Per Dr. [name] and ACT team.  The patient is discharged in  satisfactory and improved condition.   PROCEDURE:  Not applicable.                                                Jackie Plum, M.D.    GO/MEDQ  D:  08/08/2002  T:  08/08/2002  Job:  [id number]   cc:   Antonietta Breach, M.D.  [address]  [place], [place] [address]  Fax: [phone]

## 2010-05-20 NOTE — Consult Note (Signed)
[name], [name]                 ACCOUNT NO.:  [id number]   MEDICAL RECORD NO.:  [id number]          PATIENT TYPE:  INP   LOCATION:  3013                         FACILITY:  [facility]   PHYSICIAN:  Lindaann Slough, M.D.  DATE OF BIRTH:  [birth year]   DATE OF CONSULTATION:  06/15/2005  DATE OF DISCHARGE:                                   CONSULTATION   REASON FOR CONSULTATION:  Hematuria.   HISTORY OF PRESENT ILLNESS:  The patient is a 45 year old female who was  admitted with a 4-day history of rectal bleeding on June 12, 2005. She had a  normal colonoscopy on June 14, 2005.  It was reported that she had blood in  the urine when she was in the endoscopy unit.  However the patient is having  her period and a urinalysis done on June 14, 2005 showed no red blood cells  in the urine. The patient has a long history of frequency, urgency, dysuria,  pelvic pain and flank pain.  She had urethradilations in the past, also had  cystoscopy and instillation since she was a child.  She also has a history  of lupus. Today she is complaining of left flank pain and does not have  gross hematuria but she complains of frequency, urgency, suprapubic  discomfort.   PAST MEDICAL HISTORY:  1.  Positive for lupus.  2.  Angina.  3.  Anxiety.  4.  Depression.  5.  Endometriosis.  6.  Irritable bowel syndrome.  7.  History of panic attacks.  8.  History of post traumatic stress disorder.  9.  Arthritis.  10. Chronic fatigue syndrome.  11. She has had 5 or 6 miscarriages.   PAST SURGICAL HISTORY:  She has history of a left fallopian and ovary  removed,breast augmentation,Appendectomy. She is known to have  endometriosis.   ALLERGIES:  PENICILLIN, DARVOCET, CODEINE, PLAQUENIL   MEDICATIONS:  1.  Allegra 180 mg daily.  2.  Flexeril 10 mg q.8 hours.  3.  Lasix 40 mg daily.  4.  Meclizine p.r.n.  5.  Phenergan 25 mg p.r.n.  6.  Soma 350 mg p.r.n.  7.  Wellbutrin SR 1250 mg b.i.d.  8.  Xanax 0.25 mg  x4 daily.  9.  Ativan 4-5 mg at h.s.   SOCIAL HISTORY:  She is married, has a 32 year old son.  She does not smoke.  Drinks occasionally.   FAMILY HISTORY:  Heart disease. Her mother had multiple strokes and she had  heart disease.  Her father had lung cancer and rheumatoid arthritis.   REVIEW OF SYSTEMS:  She came in for rectal bleeding and has been complaining  of nausea, abdominal pain, left flank pain, frequency, urgency,  incontinence.  Remainder of review of systems is negative.   PHYSICAL EXAMINATION:  GENERAL:  This is a well-developed 39-yeaer-old  female who is anxious and concerned about the reason for her pain. She is  also complaining of left flank pain.  VITAL SIGNS:  Blood pressure is 131/84, pulse 96, respirations 20,  temperature 98.4.  ABDOMEN:  Soft, tender in the  left flank. She has moderate left CVA  tenderness. She has tenderness in the suprapubic area and both lower  quadrants.  Kidneys are not palpable. Liver and spleen not palpable. She has  no inguinal hernia.  PELVIC:  Not done.   LABORATORY DATA:  Hemoglobin is 12.6, hematocrit 36.1, WBC 6700. BUN is 13,  creatinine 0.8. Urinalysis shows no rbc's, 0-2 wbc's, nitrite negative and  rare bacteria.  CT scan of abdomen and pelvis showed normal kidneys, and left ovarian cyst.   IMPRESSION:  1.  Pelvic pain and left flank pain, etiology unknown at this time.  2.  Question of hematuria.   SUGGESTION:  Repeat urinalysis in the morning.  I doubt that her flank pain  is related to her kidney,  continue analgesics. Consider gynecologic  evaluation for ovarian cyst. We will follow the patient with you.      Lindaann Slough, M.D.  Electronically Signed     MN/MEDQ  D:  06/15/2005  T:  06/15/2005  Job:  [id number]

## 2010-05-20 NOTE — H&P (Signed)
[facility]  Patient:    [name], [name]                        MRN: [id number] Adm. Date:  [id number] Disc. Date: [id number] Attending:  Benny Lennert                         History and Physical  NO DICTATION DD:  01/04/00 TD:  01/05/00 Job: [id number]

## 2010-05-20 NOTE — H&P (Signed)
[name], Leah NO.:  [id number]   MEDICAL RECORD NO.:  [id number]          PATIENT TYPE:  EMS   LOCATION:  [facility]JO                         FACILITY:  [facility]   PHYSICIAN:  Michelene Gardener, MD    DATE OF BIRTH:  [birth year]   DATE OF ADMISSION:  06/12/2005  DATE OF DISCHARGE:                                HISTORY & PHYSICAL   PRIMARY CARE PHYSICIAN:  Miguel Aschoff, M.D.   CHIEF COMPLAINT:  Rectal bleeding which started Friday.   HISTORY OF PRESENT ILLNESS:  This is 45 year old Caucasian female who has  past medical history of multiple medical problems who presented with the  above-mentioned complaints.  The patient had previous history of GI bleed  around five years ago.  Colonoscopy was done at that time and showed  ruptured vessel which was repaired.  The last time the patient was seen by  her physician was three weeks ago for systemic lupus erythematosus flare  which was treated with prednisone.  The patient has been doing well since  then.  Starting Friday she started having rectal bleeding. The blood was  bright red blood.  There is no dark blood or clots.  Normally the blood  would come when the patient pushed very hard.  At this time, I saw some of  her blood in the commode and it was bright red blood clot.  The patient does  have nausea but she denies vomiting.  She has diffuse abdominal pain in the  epigastric and lower abdominal area.   PAST MEDICAL HISTORY:  1.  History of tachycardia, status post stress test at [facility], two years ago.  2.  Systemic lupus erythematosus.  Her last flare was around three weeks ago      and she received prednisone for that.  3.  Angina.  4.  Anxiety.  5.  Chronic fatigue syndrome.  6.  Depression.  7.  Endometriosis.  8.  Irritable bowel syndrome.  9.  History of panic attacks.  10. History of post traumatic stress disorder.  11. Arthritis.  12. History of __________.   PAST SURGICAL  HISTORY:  1.  Endometriosis about two years ago.  2.  History of fallopian tubes removal.  3.  History of breast augmentation.  4.  History of appendectomy.   ALLERGIES:  __________  .   MEDICATIONS:  1.  Allegra 180 mg p.o. once daily.  2.  Flexeril 10 mg p.o. q.8h. p.r.n.  3.  Lasix 40 mg p.o. once daily.  4.  Meclizine p.r.n.  5.  Phenergan 25 mg p.o. p.r.n.  6.  Soma 350 p.r.n.  7.  Wellbutrin SR 150 mg p.o. b.i.d.  8.  Xanax 0.25 mg four times daily.  9.  Ativan 4 mg to 5 mg p.o. at bedtime p.r.n.   SOCIAL HISTORY:  The patient denies tobacco and she denies alcohol drinking.  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  She is married  and there is some concern about the strain of her  medical illnesses on her  life.   FAMILY HISTORY:  Both brothers had myocardial infarctions.  Her mother had  multiple strokes and cardiac disease.  Her father is deceased with  rheumatoid arthritis and lung cancer.   REVIEW OF SYSTEMS:  Positive for rectal bleed, nausea, and numbness and  tingling in her right fingertips, axillary lymph node pain and tenderness.  CONSTITUTIONAL:  There is no fever, no weakness, no weight changes.  EYES:  No blurred vision, no pain, no redness.  ENT:  No difficulty swallowing.  No  hearing problems.  No epistaxis.  CARDIOVASCULAR:  No chest pain, no  shortness of breath, no palpitations, no syncope.  RESPIRATORY:  No cough,  no wheezes, no hemoptysis.  GI:  Positive for nausea and rectal bleeding.  No constipation, no diarrhea, no abdominal pain, no hematemesis.  GU:  No  frequency, no urgency and no pyuria and no hematuria.  INFECTIOUS DISEASE:  No rash, no lesions.  MUSCULOSKELETAL: Positive for fatigability and some  joint pains.  NEUROLOGIC:  Positive for numbness and tingling in her right  fingertips.  There is no weakness, no headaches and no tremors.  The rest of  the systems are reviewed and they were negative.   PHYSICAL  EXAMINATION:  VITAL SIGNS:  Temperature 97.7, pulse 96, respiratory  rate 20, blood pressure 156/78 while sitting but 188/80 in lying position.  GENERAL:  This is a middle-aged Caucasian female who is lying down in bed  and in no acute distress at the present time.  HEENT:  Conjunctivae are normal.  There is no pallor.  No erythema.  Pupils  are equal and reactive to light and accommodation. There is no ptosis.  Hearing is intact.  There is no ear discharge or infection.  There is no  nose discharge, infection or bleeding.  Oral mucosa is moist and there is no  pharyngeal erythema.  NECK:  Supple.  No JVD.  No carotid bruits.  No lymphadenopathy.  No thyroid  enlargement.  No thyroid tenderness.  CARDIOVASCULAR:  S1 and S2 were heard.  There are no additional heart  sounds.  There are no murmurs, no gallops and no thrills.  RESPIRATORY:  The patient was breathing between 16 and 18.  There is no use  of accessory muscles.  No rales, no rhonchi and no wheezing.  ABDOMEN:  Soft, mildly swollen.  There is some tenderness in the gastric  region and right and left lower quadrants.  There is no hepatosplenomegaly.  Bowel sounds are normal.  Umbilicus is central.  EXTREMITIES:  Lower extremities showed no edema, no rash and no varicose  veins.  SKIN:  No rash, no erythema.  Skin is warm to touch.  NEUROLOGIC:  Cranial nerves II-XII intact. There is no motor or sensory  deficit.  PSYCHIATRIC:  The patient is alert and oriented x3.  She is very anxious  about her current condition and about her whole general medical condition.   LABORATORY DATA:  Lab tests are pending at the present time.   IMPRESSION AND PLAN:  1.  Gastrointestinal bleed.  Will admit this patient to regular medical      floor.  Will keep her NPO.  Will start her on IV fluids.  Will give her      Protonix 40 mg IV q.12h.  Will follow her hemoglobin and, if needed,     will transfuse her.  Will get gastrointestinal consultation  for further  work-up.  2.  Systemic lupus erythematosus.  There is acute exacerbation at the      present time.  Will just follow her and develops symptoms, will start      her on steroids.  3.  Disposition.  Will continue the same home medications that she was      taking at home.   Admission time is 55 minutes.      Michelene Gardener, MD  Electronically Signed     NAE/MEDQ  D:  06/12/2005  T:  06/12/2005  Job:  [id number]   cc:   Miguel Aschoff, M.D.  Fax: [phone]

## 2010-05-20 NOTE — Group Therapy Note (Signed)
NAME:  [name], [name]                           ACCOUNT NO.:  [id number]   MEDICAL RECORD NO.:  [id number]                   PATIENT TYPE:  OUT   LOCATION:  [facility]                           FACILITY:  [facility]   PHYSICIAN:  Elsie Lincoln, MD                   DATE OF BIRTH:  [birth year]   DATE OF SERVICE:  12/30/2002                                    CLINIC NOTE   REASON FOR VISIT:  A 45 year old female who is follow up for chronic pelvic  pain.  Since her last visit, she has had an ultrasound done which revealed a  left hydrosalpinx possibly and a 2 x 2 cm lesion in the right ovary which is  consistent with a hemorrhagic cyst or small endometrioma.  Also of note, her  Pap smear was normal.  The patient has now been hysterically in pain since  our visit on November 25, 2002.  She has received narcotics from her  internist but had not had any significant relief.  She is hysterical in the  office wanting surgery and __________ would end with this pain.  After a  long discussion with her and her husband, we decided to proceed with  diagnostic laparotomy and possible left salpingectomy.  We will look  carefully at the right ovary to evaluate if this is an endometrioma or a  hemorrhagic corpus luteum.  The goal is also to do a transvaginal ultrasound  just prior to the surgery to see if the right ovary has changed in  appearance.  The patient understands the risks of this procedure include  bleeding, infection, damage to the bowel, bladder, and major blood vessels  of the abdomen.  The patient also understands that if severe endometriosis  is encountered, we will stop surgery, treat hormonally, and refer her to Dr.  Riley Kill in [facility] for management for her severe endometriosis.  She  does desire surgical resection of endometriosis and not a TAH/BSO because  she desires future fertility.  The patient was given a prescription for  Toradol 10 mg p.o. q.4-6 h. for five days total with no  refills.  She is to  use this in conjunction with the narcotics she already has until use as a  bridge to surgery.  The patient understands that there is a chance that we  will find nothing and that she will still need to be referred to Dr. Riley Kill  for chronic pelvic pain.  Will draw a CBC and CA-125 today and call the  patient with surgery appointment.                                               Elsie Lincoln, MD    KL/MEDQ  D:  12/30/2002  T:  12/30/2002  Job:  [id number] 

## 2010-05-20 NOTE — Discharge Summary (Signed)
[facility]  Patient:    [name], [name]                        MRN: [id number] Adm. Date:  [id number] Disc. Date: [id number] Attending:  Anastasio Auerbach CC:         Miguel Aschoff, M.D.  Burnett Corrente, M.D. [facility] [place], [place]  John M. Jearld Fenton, M.D.  Dorthula Matas, D.D.S.  Faith Rogue, M.D. [facility] [address] [place], [place] [address]   Discharge Summary  DISCHARGE DIAGNOSES:  1. Intractable left ear pain.     a. Etiology unclear.     b. High resolution CT of the temporal bones unremarkable.  2. Vomiting secondary to #1.  3. Chronic vertigo.  4. Depression.  5. Anxiety.  6. Polyarthritis.     a. Rheumatoid arthritis ?     b. Systemic lupus erythematosus ?  7. Fibromyalgia/chronic fatigue.  8. History of panic attacks.  9. Benzodiazepine dependence. 10. Questionable seizure disorder.     a. Likely anxiety and panic attack. 11. Breast augmentation, saline (1992). 12. Appendectomy, 1978. 13. History of rectal bleeding and abdominal pain, 1999.     a. Biopsies of the colon negative. 14. Allergy, Codeine, Darvocet, penicillin, intolerance to Plaquenil and     prednisone.  DISCHARGE MEDICATIONS:  1. (New) Neurontin 100 mg p.o. q.8h.  2. Acetazolamide 125 mg p.o. b.i.d.  3. Detrol 2 mg b.i.d.  4. Xanax 1 mg t.i.d.  5. Carisoprodol 350 mg p.o. b.i.d. and q.h.s.  6. Ativan 2 mg 2-1/2 pills q.h.s.  7. Potassium one daily.  8. Wellbutrin SR 150 mg q.d.  9. Effexor XR 150 mg q.d. 10. Prenatal vitamin q.d. 11. Vioxx 25 mg q.d. 12. Ultram 50 mg p.o. t.i.d. p.r.n. pain. 13. Allegra q.d. 14. Stool softeners two p.o. q.h.s. 15. Meclizine 25 mg p.o. t.i.d. p.r.n. vertigo. 16. Phenergan 25 mg q.6h. p.r.n. nausea. 17. Elecon cream 1% as needed.  CONDITION ON DISCHARGE:  Stable.  No longer vomiting.  Able to take medication.  DISPOSITION:  Home with husband.  RECOMMENDED ACTIVITY:  As tolerated.  No driving until follow up  with [name].  RECOMMENDED DIET:  As tolerated.  FOLLOWUP:  The patient is to call [name] to schedule a followup in the next 4 to 5 days.  At that visit he should consider referral to ear, nose, and throat specialist.  I spoke with Dr. Jearld Fenton this admission.  She may also need referral for a second opinion from an oral surgeon.  I would consider referral to the pain clinic if she is willing, and also psychiatry for help with managing her medications.  This may not be necessary, and will be based on primary cares assessment.  CONSULTATIONS:  None.  PROCEDURES:  High resolution CT of the temporal bone:  Report called to me was normal.  Official report not in chart yet.  HOSPITAL COURSE:  INTRACTABLE EAR PAIN:  [name] is a 45 year old Caucasian female with a history of fibromyalgia, panic attacks, and progressive left ear pain.  It started approximately 6 to 8 months ago.  She saw Dr. Imogene Burn at [facility] in September 2001, when she was in the hospital for a lupus flare.  At that time, an MRI of her brain was done which was read as normal.  I spoke with his partner on the phone who reviewed the chart, and there was indication that Dr. 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  [name] saw a Education officer, community, Golden Pop, on the day she was referred for admission. Based on her and her husbands report, it seemed that Dr. 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  [name] was vomiting on admission, and was unable to take her multiple psychotropic and pain medications.  She was admitted for IV hydration, pain control, and nausea control.  She initially required IV Demerol.  Her symptoms slowly improved.  We attempted to obtain medical records from her multiple physicians, and had some success as I have described above.  I opted to place her on Neurontin to see if this might help. We did do a high resolution CT of the temporal bone area  to look for any bony abnormality or soft tissue mass.  The report was called into to me as normal. At the time of discharge, [name] was off oral narcotic pain medication except for the Ultram she had previously at home.  She will follow up with [name] for further assessment.  Of note, her external ear canal and drum all appeared normal by my examination.  I was in contact with Dr. Jearld Fenton who would be happy to see her in the office if needed.  If Dr. Bradly Chris did indeed feel that this was not TMJ, the patient may want a second opinion.  The Neurontin can be titrated up as needed.  LABORATORY DATA:  Hemoglobin 12.3, MCV 94, WBC 6100, platelet count 224, sedimentation rate 6.  Sodium 142, potassium 3.5, chloride 108, bicarbonate 26, glucose 113, BUN 6, creatinine 0.8, calcium 9.5, albumin 3.3, SGOT 28, SGPT 23, alkaline phosphatase 44, total bilirubin 0.4.  Urinalysis negative. DD:  01/10/00 TD:  01/10/00 Job: [id number]

## 2010-05-20 NOTE — Discharge Summary (Signed)
NAME:  [name], [name]                           ACCOUNT NO.:  [id number]   MEDICAL RECORD NO.:  [id number]                   PATIENT TYPE:  INP   LOCATION:  9134                                 FACILITY:  [facility]   PHYSICIAN:  Lesly Dukes, M.D.              DATE OF BIRTH:  1965/12/26   DATE OF ADMISSION:  01/05/2003  DATE OF DISCHARGE:  01/07/2003                                 DISCHARGE SUMMARY   HISTORY OF PRESENT ILLNESS AND REASON FOR HOSPITALIZATION:  The patient is a  45 year old female with chronic pelvic pain for several months and a known  left hydrosalpinx, presented for diagnostic laparoscopy. The patient has no  history of endometriosis, PID, or major abdominal surgery. She has had an  appendectomy in the past; however, it was not ruptured at the time.  The  patient has been requiring narcotics for the past month to manage her pain.  A diagnostic laparoscopy was undertaken to evaluate any pathology.   SIGNIFICANT FINDINGS:  There were multiple adhesions involving his liver and  anterior abdominal wall. There were adhesions and bilateral adnexa  consistent with old PID. There is a left hydrosalpinx that was successfully  removed laparoscopically. The fundus of the uterus had white papules of  unknown etiology.  There were no powder burns, marked or scarring suggestive  of old endometriosis. Bilateral ovaries were grossly normal. There were no  ovarian cysts noted, surgically absent, and the cul-de-sac was free of  adhesions. Procedure performed was diagnostic laparoscopy and two days of  inpatient pain management. The patient was discharged in stable condition on  p.o. Mepergan for pain. The patient is instructed to follow with Dr. Charolette Child,  in [facility], for chronic pelvic pain who instructed this patient that it  was very important that she make this appointment this week as here at  [facility] they cannot address her pain issue any more. She is to see  the  specialist. She is follow up with Dr. Penne Lash in one week for a  postoperative visit.   Postoperative instructions include regular activities, all preoperative  medications are to be resumed, regular diet, and Mepergan for pain as  needed.                                               Lesly Dukes, M.D.    [name]  D:  01/29/2003  T:  01/30/2003  Job:  [id number]

## 2010-05-20 NOTE — Consult Note (Signed)
[name], [name]                 ACCOUNT NO.:  [id number]   MEDICAL RECORD NO.:  [id number]          PATIENT TYPE:  INP   LOCATION:  3013                         FACILITY:  [facility]   PHYSICIAN:  [name]. [name], MD       DATE OF BIRTH:  January 08, 1965   DATE OF CONSULTATION:  06/16/2005  DATE OF DISCHARGE:  06/16/2005                                   CONSULTATION   PALLIATIVE MEDICINE CONSULTATION:  Dr. [name] requested palliative medicine to assist with managing the  pain in this patient. The chart is reviewed, prolonged discussion with the  patient and her husband for almost two hours, the patient examined.   IMPRESSION:  1.  Chronic abdominal and pelvic pains from multiple sources; suspect      endometriosis pain plus functional/irritable bowel as the major causes      with exacerbating psychological factors of depression, post-traumatic      stress disorder, panic disorder.  2.  Other medical problems including systemic lupus erythematosus,      rheumatoid arthritis (per patient's statement), chronic fatigue      syndrome, previous abdominal surgeries (appendectomy, fallopian tube and      ovary removal secondary to endometriosis).  3.  Acute emotional distress over the lack of explanation over the lack of      explanation for the blood she sees in the toilet, despite workup here.   RECOMMENDATIONS:  1.  The patient's pain is managed at home with a variable regimen of      Tylenol, ibuprofen, and opioid/acetaminophen combination pills. The      patient probably would be best served by continuing her regimen at home      rather than adding new opioids to her regimen here.  2.  Management of her emotional problems will be of paramount importance in      helping her live with the chronic pain from her abdominal problems noted      above. This can be done as an outpatient and probably would be more      beneficial than continued pain management in the hospital.  3.  GYN  rehabilitation of endometriosis is appropriate. This can be done as      an outpatient.  4.  Because prognosis for successful pain relief is poor, a chronic pain      clinic may be worth considering in follow-up.   HISTORY OF PRESENT ILLNESS:  A 45 year old woman admitted to the hospital on  June 11th with complaints of four days of seeing blood in the stool. This  was associated with significant worsening of her recurrent and chronic  abdominal pain. She has had evaluation here including a colonoscopy which  was completely without evidence of cause of bleeding or evidence of blood in  the colon at the time of the examination. The patient has had urology  evaluation because of the concern that the blood might be coming from the  urine. The urologist indicates that he believes the patient has a pelvic  pain syndrome and there is no evidence of urologic  cause of blood being  present. The patient had a CT scan of the pelvis which was negative except  for presence of a left ovarian cyst.   The patient relates she has grave concerns about the fact that there is no  explanation for why she is seeing blood in her stool. She gives a history of  abdominal pain for a decade or more, this being worse in the past five  years. She indicates that it is sharp and knife like, and tends to be  markedly worse with her menstrual period. She actually has had her menstrual  period over the last few days and is just finishing that at present. She  also notes abdominal pain when she is straining with urine, which she tends  to have to do once or twice a day. She relates a history of having urethral  dilatation times two as a child.   The patient also discusses chronic and diffuse joint pains which are  diagnosed as systemic lupus erythematosus and rheumatoid arthritis. She does  not see a rheumatologist at present, but gets care from her primary care  physician, Dr. [name], at [facility],  [facility]  site. She states that she gets prednisone injections to help the joint  pains, but recently the relief of discomfort has lasted only a few days. She  previously had been on Plaquenil for her SLE, but that had to be stopped  because she had seizures as an adverse effect. Her  husband states he has  taken her to [facility] as well as to [facility] for evaluation of her lupus in  the past. The steroid injections have been most helpful of any medications  for her, although she takes either acetaminophen or ibuprofen at home,  usually four pills daily, and some times takes oxycodone/acetaminophen  tablets in addition. She tries to avoid the latter, however, because she  does not want to be using too much strong pain medication.   The patient indicates she has pains related to irritable bowel, which she  has had for many years. The pains are associated with episodes of nausea,  diarrhea, and also with constipation. She also relates a recent  midepigastric pain.  Her concern about the bleeding is influenced in part by  the fact that her mother had episodes of blood in the stool that apparently  the doctors had a hard time finding a cause for. The patient's mother died  just three weeks ago from complications of chronic obstructive airway  disease.   The patient says that the best relief for her joint pains and pain in  general is exercise, which she generally does a lot of; but she has not been  able to do her routine exercises recently because she has a painful nodule  on the right wrist. From this area the pain radiates up to the shoulder as  well as into her thumb and first two fingers. She had seen [name] for  this, and says that she has an appointment with the surgeon, which she had  to miss because of feeling poorly that day. This actually occurred twice,  and she was hoping to see a surgeon see this problem while she was here in the hospital. She feels strongly that that  nodule must be removed for her to  feel better and be able to get to exercising as she usually does.   Further discussion of her emotional state reveals she has a  history of panic  disorder. She describes symptoms of palpitations, shortness of breath, chest  pain, dizziness with these episodes. She has had various medications used  for this, but most recently has found that Xanax taken in 2 mg t.i.d. dosing  and then on a p.r.n. basis when she feels a panic attack coming on has been  most effective. She also takes Ativan 5 mg at bedtime for sleep and also  takes muscle relaxants to help with her overall muscle and joint discomfort.  She takes Phenergan 25 mg or 50 mg b.i.d. to help with her nausea.   The patient says she feels extremely depressed about her overall situation.  She sleeps poorly, taking the Ativan to assist as above. Her interest in  doing things is variable, with some days feeling like she wants to do a lot,  but more commonly not feeling able to do very much. She does not feel guilty  and feels like she has a positive self-worth. Energy is severely poor.  Appetite is variable. She has not lost or gained weight.  She has no  thoughts of suicide, although she says that this is mainly because suicide  is a sin and she would go to hell. Otherwise, she would be probably  seriously considering suicide. She had a suicide attempt five years ago from  taking an overdose of medication.   PAST MEDICAL HISTORY:  As above. The patient also relates chronic fatigue  syndrome, posttraumatic stress disorder, endometriosis surgery with one  ovary in the contralateral fallopian tube removed (the patient wanted to  leave the other tube and ovary in place so that she could have more  children).   She has had problems with palpitations and chest pain with workup that has  apparently been negative except for tachycardia treated with beta blockers.  Other surgeries include appendectomy and  breast augmentation.   SOCIAL HISTORY:  The patient has been married for the last 11 years to  husband Casimiro Needle, although they have been together for 17 years. The patient  has a 58 year old son. 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 Her father died two years ago, with patient  being much more depressed since that occurred. Mother died three weeks ago.  The patient says church has been extremely important to her, attending the  [employer]. She credits her religion with saving her life, allowing her  to continue going despite all of her problems.  Her husband is very involved  in the church as well.   FAMILY HISTORY:  As above.   REVIEW OF SYSTEMS:  In addition to the above, the patient indicates that she  feels feverish daily, feels fatigued most of the time, often has a feeling  of being spaced out, vertigo for which she takes meclizine p.r.n., panic attacks on a pretty much daily basis, chest pains and tachycardia as noted  above, nausea whenever she gets significant pain. She has not lost weight or  gained any significant amount.   MEDICATIONS:  1.  Bupropion 150 mg b.i.d.  2.  Furosemide 40 mg daily p.o.  3.  Mometasone self administered.  4.  Pantoprazole 40 mg q.12h.  5.  Tylenol w/codeine one or two tabs q.4h. p.r.n.  6.  Fentanyl 200 mg given once.  7.  Midazolam 20 mg given once.  8.  Morphine sulfate 4 mg given once.  9.  Phenergan 25 mg p.o. q.6h. p.r.n.  10. Alprazolam 2 mg given once.   PHYSICAL EXAMINATION:  VITAL SIGNS: Temperature 97.2, pulse 115,  respirations 20, blood pressure 120/84, O2 saturation 98% on room air.  GENERAL: Alert woman appearing stated age, lying in bed, but getting up once  to go to the bathroom and walking without difficulty. She is cooperative  with the exam.  HEENT: Normocephalic. Hearing is acute. Eyes without discharge or  inflammation. Nose without discharge. Mouth showing good hygiene,  not  dehydrated.  NECK: Without masses or nodules. The patient complains of some tenderness  during palpation of the neck.  LUNGS: Clear to auscultation, good air movement.  HEART: Rhythm is regular with grade 1/6 systolic murmur heard at the left  sternal border. Question of a faint click present, also.  BREASTS:  Not examined.  ABDOMEN: Scar of appendectomy. Did not distended or obese. Bowel sounds  normal. The patient was tender to palpation diffusely, particularly in the  lower quadrant bilaterally. No rebound or guarding.  GENITALIA: Not examined.  EXTREMITIES: No evidence of joint swelling or erythema. No edema.  SKIN: Well tanned diffusely.  Piercing of umbilicus as well as two facial  piercings present.  NEUROLOGIC: No evidence of cranial nerve abnormalities. The patient  ambulated well without difficulty.  No suggestion of cerebellar abnormality.  MENTAL STATUS: The patient was alert and initially cooperative. Thought  content was focused particularly on her history of bleeding and the multiple  pains. She appeared somewhat anxious.  Mood was somewhat voluble. Initially  she was very calm and cooperative. When I suggested that she perhaps would  be better off if she went home to manage her pain at home she became  extremely angry, got out of bed and said that she was going to go home. She  stated that she was going to make sure that my name was on the television  news tonight and also in the newspapers as giving bad advice. She also said  that she would be going to her lawyer to sue me and multiple other persons  at the hospital for the care that had been given. She noted that they would  go to a different hospital and find out what really was going on. Her  husband tried to calm her during this session of angry talk, but she was  very insistent on continuing. There was no evidence of hallucinations or  delusions. Speech was clear and not dysarthric. Insight appears  fair. Judgment appears questionably impaired, based on her responses.   PERTINENT LABORATORY DATA:  Most recent hemoglobin and hematocrit of 12.6  and 36.1. There has been no significant change of this since she came to the  hospital. INR is normal at 0.9. General chemistry panel has been normal.  Urinalysis showed no evidence of red cells on June 13th, but too numerous to  count on June 15th. Nitrite and leukocyte tests were negative. No white  blood cells, rare bacteria on both urinalysis.   DISCUSSION:  The patient's violent reaction to my suggestion that she might  be better off managing her pain at home is suggestive of very substantial  emotional overlay with this entire situation. If this is a recurrent action,  would need to consider borderline or other personality disorder. The fact of  her mother having had apparently undiagnosed cause of bleeding for some time  may be complicating this, but more significantly is her other diagnoses  enumerated above, particularly the depression, which is a known cause of  chronic pelvic pain, and her endometriosis, with functional bowel. The  patient does not appear to have good insight into these problems and the  best approach to management. This will make the management very difficult.  The most approach continuing management would be by her primary care  physician with assistance of consultants as deemed appropriate by the  primary care physician and the patient. I have suggested that she continue  to follow closely with [name] for this reason.   Thank you for the opportunity to attempt to help this patient. If you would  like to talk with me further about the case, please call me on my cell  phone, [phone].   Addendum: the patient repeatedly asked me to look at the blood from her  rectum that was in the toilet collection containers. I saw two hat  collection devices, one in fron of the other (one for the urine, one for the  stool).  The rear container had pink-red colored water filling most of the  hat (?500 cc). There was no evidence of mucus or stool present. Given the  colonoscopy findings this raises the question of fictitious hematochezia  with the blood actually coming from the menstrual flow.      [name]. [name], MD  Electronically Signed     NKH/MEDQ  D:  06/16/2005  T:  06/16/2005  Job:  [id number]   cc:   [name], MD  [facility]

## 2010-05-20 NOTE — H&P (Signed)
NAME:  [name], [name]                           ACCOUNT NO.:  [id number]   MEDICAL RECORD NO.:  [id number]                   PATIENT TYPE:  EMS   LOCATION:  MAJO                                 FACILITY:  [facility]   PHYSICIAN:  Melissa L. Ladona Ridgel, MD               DATE OF BIRTH:  August 24, 1965   DATE OF ADMISSION:  07/26/2003  DATE OF DISCHARGE:                                HISTORY & PHYSICAL   CHIEF COMPLAINT:  Abdominal pain with increasing episodes of chest  discomfort.   PRIMARY CARE Al Gagen:  The patient is a patient of Dr. Vianne Bulls.   HISTORY OF PRESENT ILLNESS:  The patient is a 45 year old white female who  presents to the emergency room with worsening accounts of abdominal pain,  crampy in nature, with associated nausea and vomiting.  The patient also has  noted over the past couple of weeks and has been treated in the urgent care  setting for increasing peripheral edema.  She states that she has noticed a  weight gain from 145-170 over the past couple of months.  She was given an  increased dose of Lasix to take, which has helped somewhat.  The patient  also complains of some perineal pain/irritation which has prevented her from  participating in sexual intercourse.  She denies any vaginal discharge at  this time and states it is all pretty much external.  The patient was found  in the emergency room to have a rather extensive urinary tract infection.  She was noted to be severely anxious and tachycardic with increased blood  pressures.  She displays tangentiality in her speech and is repetitive; she  is definitely fixated on the recent death of her father and the implications  of that in her life.   REVIEW OF SYSTEMS:  Review of systems is significant for episodic dizziness  for which she treats herself with Meclizine, chest pain which is increasing  in frequency, is fairly diffuse, it is not associated with any specific  activity.  She does complain of some increased  visual loss in the left eye  which she notices when she tries to put her makeup on.  She does express an  anxiety and some depression over her chronic medical illness and recent  family losses;  she does, however, deny suicidality at this time.  She  denies any vaginal discharge, hematuria, melena or hematochezia.  She is  recovering from recent endometriosis surgery with good healing of her  wounds.   SOCIAL HISTORY:  She denies tobacco or ethanol.  She is a Associate Professor by  trade and has a 45 year old son who she is very proud of.  She is married  and does raise some concerns about the strain of her medical illness on her  marital life, particularly her husband's satisfaction, both physically and  mentally.   FAMILY HISTORY:  Both her bothers have had  myocardial infarcts.  Her mom has  had multiple strokes and cardiac disease.  Her father is deceased with  rheumatoid arthritis and lung cancer.   PAST MEDICAL HISTORY:  Her past medical history is significant for  tachycardia which has been relatively longstanding.  She had a stress test  in [place] many years ago.  She has not really been keeping 1 set of  physicians in line to care for her secondary to financial constraints.  She  has noticed increased episodes of hypertension and increased tachycardia.  She has episodes of confusion and does have a history of seizures, has been  seen in the past by Dr. Isabell Jarvis, a neurologist in [place].  As stated,  she complains of some visual loss.  Old records indicate that she had rectal  bleeding in the past resulting in a colonoscopy in 1999 which showed no  active disease.  Her last menstrual period was prior to July 10, 2003 and she  states that she is late but cannot give me the number of days.  She does  have a history by old records of previous suicide attempt which is fairly  extensive.   PAST SURGICAL HISTORY:  1. Endometriosis procedure done several weeks ago.  2. She did have  a fallopian tube removed.  3. She has had breast augmentation.  4. She has had an appendectomy.   ALLERGIES:  Allergies are to PLAQUENIL, which causes seizures, DARVOCET,  CODEINE, PENICILLIN and there is a listing of PREDNISONE in the old chart,  although the patient is taking prednisone.   MEDICATIONS:  Medications at this time are:  1. Toprol-XL 50 mg p.o. daily.  2. Lasix 10 mg 1-2 tablets p.o. q.a.m.  3. Xanax 10 mg p.o. t.i.d.  4. Ativan 2 to 2.5 mg p.o. nightly.  5. Wellbutrin XL 150 mg p.o. b.i.d., which was recently increased.  6. She takes Flonase, Careers adviser and Meclizine p.r.n.  7. She also uses 50 mg of Phenergan for nausea caused by her dizziness.  8. She is also listed as being on a prenatal vitamin once daily and     __________.   PHYSICAL EXAMINATION:  VITAL SIGNS:  Temperature was 103, blood pressure was  177/88 with a pulse of 127-149 and respiratory rate ranges from 18-24.  She  is 92% at one point, 99% on 2 L.  GENERAL:  Her general exam reveals a severely anxious white female in  distress secondary to a flight of ideas, tangential thought.  HEENT:  She is normocephalic, atraumatic, with pupils equal, round and  reactive to light.  Extraocular muscles are intact.  Mucous membranes are  moist.  She has multiple piercings on her face and identifies several bumps  that come up related to her lupus; they appear acneiform in nature.  NECK:  Her neck is supple.  There is no JVD, no lymph nodes, no carotid  bruits.  She has no thyromegaly.  CHEST:  Decreased breath sounds at the bases but otherwise clear to  auscultation.  CARDIOVASCULAR:  She is tachycardic; positive S1 and S2; no S3 or S4; no  murmurs, rubs, or gallops are appreciated.  ABDOMEN:  Abdomen is soft, minimally tender related to her bladder.  She has  positive bowel sounds, no bruits, no hepatomegaly, no rebound or guarding.  PELVIC:  Her perineum is cleanly shaven.  She has no discharge and  no significant erythema to the external genitalia.  An internal exam is  deferred at this  time.  RECTAL:  Perirectal exam appears clean without lesions/discharge.  Internal  exam is deferred at this time secondary to pain.  EXTREMITIES:  There appeared to be no clubbing, cyanosis, or edema, although  the patient feels that she has got some edema in her hands and feet; they do  appear normal at this time.  BACK:  She has no sacral edema.  NEUROLOGIC:  Neurologically, the patient is awake, alert, oriented x3.  Cranial nerves II-XII appear intact.  She does not appear to have any visual  field deficits, although it is difficult to assess at this time, since she  is very anxious.  Her power is 5/5.  DTRs are hyperactive at 2++.  Plantars  are downgoing.  PSYCHIATRIC:  Psychiatrically, she has some tangential thought, flight of  ideas, pressure of speech, severe anxiety.   PERTINENT LABORATORY VALUES:  Pertinent laboratory values at the time of  admission reveal a creatinine of 1.0; sodium is 137, potassium is 3.8,  chloride is 102, BUN is 15, glucose is 91; her pH is 7.36 with a PCO2 of  48.2 and a bicarb of 27.2 with a TCO2 of 29; her white count is 10.6 with a  hemoglobin of 12.4 and hematocrit of 36.3; she has platelet counts of  273,000; her pregnancy test is negative.  Her urine appears with specific  gravity of 1.016, a pH of 6, protein is negative, nitrites are positive,  leukocyte esterase is large; she has 21-50 wbc's and 0-2 rbc's;  there are  many bacteria.   EKG is pending.   Chest x-ray is pending.   Acute abdominal series is pending.   ASSESSMENT AND PLAN:  This is a 45 year old white female with a past medical  history significant for lupus, rheumatoid arthritis and very severe anxiety.  She presents with worsening abdominal pain and cramping with nausea and  vomiting.  She was found to have a urinary tract infection in the emergency  room and also was found to be  tachycardic and hypertensive without  significant dehydration.   1. Genitourinary:  We will continue Cipro intravenously.  Foley catheter has     been placed.  We will use a B&O suppository to control her bladder     spasms.  2. Gastrointestinal:  At this time, we will keep the patient n.p.o. in order     to assess her renal ultrasound.  No Protonix is required at this time.  3. Endocrine:  We will check a TSH,  T3 and T4.  4. Cardiovascular:  Sinus tachycardia of unknown origin, likely     multifactorial.  We will check a TSH, T3, T4 and treat her with p.r.n.     intravenous Lopressor as well as increase her oral Toprol-XL to 75 mg     daily.  We will check a cardiac panel.  5. Pulmonary:  Lungs appear clear to auscultation, although they are     decreased at the bases.  At this time, we will check a chest x-ray to     rule out any obvious abnormalities.  We may wish to consider a chest CT     to rule out pulmonary emboli, which would explain some of her tachycardia    on a chronic basis as well as some of the hallucinatory behavior related     to oxygen deficit and this also might also explain her chronic sense of     shortness of breath.  6. Lupus:  Rule  out possibility of flare by checking a double-stranded DNA,     complement 50, complement 3, complement 4.  At this point, her urine does     not contain protein and otherwise not showing signs of nephritis,     definitely signs of urinary tract infection.  If the patient appears to     be showing signs of a lupus flare, pulsed steroids should be considered.  7. Psychiatry:  The patient obviously has a lot of posttraumatic issues as     well as severe, severe anxiety.  A psychiatric consult would be     recommended at this time to assess and since she is not following with     anybody on a regular basis.  I will continue her Xanax and her Ativan at     bedtime, as well as her Wellbutrin, but there may be a better combination     of  medications to try to help this young woman get through the day.  At     this time, the patient denies suicidal ideation, although she has in the     past expressed suicidal ideation and acted upon it with severe     consequences.  8. Gynecological:  At this time, the patient does not show external signs or     symptoms of a vaginal yeast infection, however, with recent surgery, I     will add Flagyl to cover bacterial vaginosis and if necessary, will     recommend an     internal vaginal exam.  At this time, the patient is quite uncomfortable     to the point where exam would be very painful and an     obstetrical/gynecological consult might be the best choice for this     woman, once her abdominal pain has resolved.                                                Melissa L. Ladona Ridgel, MD    MLT/MEDQ  D:  07/26/2003  T:  07/26/2003  Job:  [id number]   cc:   C. Duane Lope, M.D.  [address]  [place]  [place] [address]  Fax: [phone]

## 2010-05-20 NOTE — Op Note (Signed)
[name], [name]                 ACCOUNT NO.:  [id number]   MEDICAL RECORD NO.:  [id number]          PATIENT TYPE:  INP   LOCATION:  3013                         FACILITY:  [facility]   PHYSICIAN:  Graylin Shiver, M.D.   DATE OF BIRTH:  1965-03-19   DATE OF PROCEDURE:  06/14/2005  DATE OF DISCHARGE:                                 OPERATIVE REPORT   PROCEDURE:  Colonoscopy.   INDICATIONS:  Rectal bleeding.   Informed consent was obtained after explanation of the risks of bleeding,  infection and perforation.   PREMEDICATION:  Fentanyl 150 mcg IV, Versed 15 mg IV.   DESCRIPTION OF PROCEDURE:  With the patient in the left lateral decubitus  position a rectal exam was performed.  No masses were felt.  The Olympus  colonoscope was inserted into the rectum and advanced around the colon to  the cecum.  Cecal landmarks were identified.  The cecum and ascending colon  were normal.  The transverse colon normal.  The descending colon, sigmoid,  and rectum were normal.  The scope was retroflexed in the rectum and some  small nonbleeding internal hemorrhoids were seen.  The scope was  straightened.  There was absolutely no blood seen in the colon.  She  tolerated the procedure well without complications.   IMPRESSION:  Normal colonoscopy to the cecum with internal hemorrhoids.   The patient had a urinate a couple of times when she was in the Endo unit  and her urine was reported as being bloody.  We will obtain a urinalysis and  urine culture; question as to whether or not she might need a urology  consult.  Will leave this up to primary care hospitalist service.  The  patient will be given Anusol HC suppositories for her hemorrhoids.           ______________________________  Graylin Shiver, M.D.     SFG/MEDQ  D:  06/14/2005  T:  06/14/2005  Job:  [id number]   cc:   Miguel Aschoff, M.D.  Fax: [phone]

## 2010-05-20 NOTE — Op Note (Signed)
   NAME:  [name], [name]                           ACCOUNT NO.:  [id number]   MEDICAL RECORD NO.:  [id number]                   PATIENT TYPE:  INP   LOCATION:  5029                                 FACILITY:  [facility]   PHYSICIAN:  Jackie Plum, M.D.             DATE OF BIRTH:  1965-02-13   DATE OF PROCEDURE:  DATE OF DISCHARGE:                                 OPERATIVE REPORT   Dictation ended at this point.                                               Jackie Plum, M.D.    GO/MEDQ  D:  08/08/2002  T:  08/08/2002  Job:  [id number]

## 2010-05-20 NOTE — Op Note (Signed)
NAME:  [name], [name]                           ACCOUNT NO.:  [id number]   MEDICAL RECORD NO.:  [id number]                   PATIENT TYPE:  INP   LOCATION:  9134                                 FACILITY:  [facility]   PHYSICIAN:  Lesly Dukes, M.D.              DATE OF BIRTH:  1965/09/06   DATE OF PROCEDURE:  01/05/2003  DATE OF DISCHARGE:                                 OPERATIVE REPORT   PREOPERATIVE DIAGNOSIS:  A 45 year old female with chronic pelvic pain and  left hydrosalpinx.   POSTOPERATIVE DIAGNOSES:  69. A 45 year old female with chronic pelvic pain and left hydrosalpinx.  2. Pelvic adhesions.   PROCEDURES:  1. Diagnostic laparoscopy.  2. Lysis of adhesions.  3. Left salpingectomy.   SURGEON:  Lesly Dukes, M.D.   ASSISTANT:  Shelbie Proctor. Shawnie Pons, M.D.   ANESTHESIA:  General.   ESTIMATED BLOOD LOSS:  Less than 50 mL.   COMPLICATIONS:  None.   PATHOLOGY:  Left fallopian tube.   FINDINGS:  Adhesions involving the liver and anterior abdominal wall;  adhesions bilateral _____________ ; left hydrosalpinx; fundus of uterus with  _____________.  See pictures for more explanation; no powder burn or  scarring  on the endometriosis; bilateral ovaries grossly normal;  _______________abdomen; cul-de-sac free of adhesions.   DESCRIPTION OF PROCEDURE:  After informed consent was obtained, the patient  was taken to the operating room and placed in the dorsolithotomy position.  The patient was placed in dorsal lithotomy position and prepared and draped  in a sterile fashion.  The bladder was in and out catheterized with a red  Roxan Hockey, and the anterior lip of the cervix was grasped with a Hulka clip  to aid in uterine mobility.  An infraumbilical skin incision was then made  with a scalpel and bluntly carried down to the under layer of fascia.  The  fascia was incised in the midline and extended bilaterally with blunt and  sharp dissection.  The fascia was then tagged with 0  Vicryl.  The peritoneum  was then identified and entered bluntly.  The Hasson trocar was then placed  in the abdomen.  The laparoscope was introduced, and a pneumoperitoneum was  achieved with CO2.  The patient was placed in Trendelenburg, and a  suprapubic port was then inserted under direct visualization.  This was  approximately 2 cm above pubic symphysis in the midline.  Again, this was  done under direct visualization.  The bowel was pushed to the back of the  abdomen and intra-abdominal cuts performed.  At this time, a left  salpingectomy was then undertaken.  A left pelvic 5 mm sidewall port was  then placed under direct visualization.  A tripolar was then used to  cauterize and ligate the left fallopian tube.  The fallopian tube was passed  through the limb of the port and sent off to pathology.  The abdomen was  then copiously irrigated, and the left adnexa was found to be hemostatic.  The left ureter was also found to be peristalsing and well below any area of  cauterization.  The two 5 mm ports were removed; the pneumoperitoneum was  released.  The 12 mm infraumbilical port was removed with the laparoscope in  place to reduce the risk of herniation.  The fascia was then closed with 0  Vicryl and found to be hemostatic.  The skin incisions were closed with  Dermabond.  The Hulka clip was removed from the cervix, and the cervix was  completely hemostatic.  The patient tolerated the procedure well.  Sponge,  lap, instrument, and needle counts correct x2, and the patient ___________.                                               Lesly Dukes, M.D.    [name]  D:  01/05/2003  T:  01/05/2003  Job:  [id number]

## 2010-05-20 NOTE — Discharge Summary (Signed)
[name], BOSCH                           ACCOUNT NO.:  [id number]   MEDICAL RECORD NO.:  [id number]                   PATIENT TYPE:  INP   LOCATION:  4713                                 FACILITY:  [facility]   PHYSICIAN:  Isla Pence, M.D.             DATE OF BIRTH:  April 18, 1965   DATE OF ADMISSION:  07/26/2003  DATE OF DISCHARGE:  08/01/2003                                 DISCHARGE SUMMARY   DISCHARGE DIAGNOSES:  1. Escherichia coli urinary tract infection accounting for some of abdominal     pain.  2. Atypical chest pain.  Ruled out for myocardial infarction by serial CKs,     except for elevated troponin.  Subsequently had a two-day stress     Cardiolite which was negative for ischemic changes nor any suggestion of     infarction.  The patient was also ruled out for pulmonary embolism by CT     of the chest.  3. Elevated troponin of unknown etiology.  This has since normalized.  4. Chronic pain in the chest, abdomen, and pelvis with chronic pelvic pain     syndrome.  The patient is supposed to be seen at [facility] for her     chronic pelvic pain.  5. History of hypertension.  6. History of lupus with negative studies during this hospital stay.  7. History of tachycardia, which has primarily been sinus while she has been     on telemetry here.  8. History of depression and anxiety disorder.  9. Apparent history of a seizure episode in the past.  10.      Episodic edema for which she is on Lasix.  11.      Seasonal allergies.  12.      History of longstanding constipation with an episode of rectal     bleeding in the past resulting in a colonoscopy in 1999 which apparently     was negative.  13.      Amenorrhea for the past five to six weeks that she complained to [name]     about with urine pregnancy test being negative.  This needs to be further     worked up through her primary care physician and gynecologist.  14.      Previous history of suicide attempt.  15.       Previous history of urinary retention with no retention discovered     during hospital stay with bladder scan being negative for significant     urine volume.  16.      Dyslipidemia with a low HDL at 29.  I have not initiated treatment,     but would recommend something like Niaspan on this lady, but I will leave     to [name] to initiate once she is over this acute post hospitalization.  17.      Left ovary cyst which needs followup  ultrasound in six weeks.  18.      Mild normocytic normochromic anemia to be further worked up as an     outpatient.   PAST SURGICAL HISTORY:  1. Surgery for endometriosis.  2. She has also had I believe the left fallopian tube removed.  3. History of breast augmentation surgery.  4. History of appendectomy.   DISCHARGE MEDICATIONS:  1. Cipro 500 mg.  She has just one more dose left to take this evening by     mouth to complete a seven-day course.  2. Metoprolol XL.  This has been increased to 75 mg p.o. daily, primarily     more from a tachycardia standpoint.  3. Xanax 2 mg p.o. t.i.d. as she has been taking before.  The patient says     she does not have enough of Xanax, so she will be given two weeks' worth     of Xanax until she is seen by [name].  The rest is through [name].  4. Ativan 1 mg p.o. q.h.s. as before.  She says she has enough of Ativan.  5. Wellbutrin XL 150 mg p.o. b.i.d. as before.  6. Flonase as before.  7. Allegra as before.  8. Meclozine as before.  9. Phenergan.  She says she takes 50 mg p.o. q.4-6h. p.r.n. nausea as     before.  10.      Detrol LA 4 mg p.o. daily as before.  11.      Prenatal vitamins one p.o. daily.  12.      Vicodin 5/500 mg one to two tablets p.o. q.4-6h. p.r.n. pain.  The     patient will be given 40 of these tablets.  The rest is through [name].  13.      Zelnorm 6 mg p.o. b.i.d.  14.      MiraLax 17 g to be dissolved in 8 ounces of water and taken by     mouth daily.   ACTIVITY:  As  tolerated.   DISCHARGE DIET:  Regular.   SPECIAL INSTRUCTIONS:  The patient will need a followup ultrasound of her  pelvis for the left ovary cyst in six weeks.  This can be ordered by Dr.  Tenny Craw.   FOLLOWUP:  Follow up with Miguel Aschoff, M.D., in one week.  She is to get an  appointment with [facility], as previously recommended to  next work up her chronic pelvic pain.   HOSPITAL COURSE:  This is a 45 year old female with a highly complicated  medical history, who essentially was admitted to the [facility] with abdominal pain with increasing episodes of chest pain.  Please  refer to the initial dictation done by Melissa L. Ladona Ridgel, M.D., for details  of that presentation.  The patient was admitted to our service and found to  have a urinary tract infection, which subsequently ended up being  Escherichia coli UTI with the Escherichia coli strain being resistant to  ampicillin and Bactrim, but sensitive to all other antibiotics.  The patient  was initially started on IV Cipro and subsequently switched to Cipro by  mouth.  She will complete her full seven-day course with Cipro today.  There  is a concern of whether there might be some evidence of pyelonephritis,  however, this was negative by renal ultrasound and by CT scan.  Clinically  she really did not have any CV angle tenderness either.  Because of her  abdominal  pain (she actually has chronic abdominal pain), a CT of the  abdomen and pelvis was also done, which essentially showed negative  pathology in the abdomen with the liver and spleen looking normal.  The  stomach was decompressed.  The duodenum, pancreas, gallbladder, adrenal  glands, and kidneys show no focal abnormality.  On the CT of the pelvis  noted was a 4.7 x 4.2 cm left ovarian cyst without evidence for  intraperitoneal free fluid.  There was no evidence of a right adnexal mass. The terminal ileum was also unremarkable.  The appendix  was not visualized.  There was evidence for lymphadenopathy.  Noted was a Foley catheter in place  with associated bladder air.  The patient was started back on her Detrol LA  and I think she has not had some of her medications due to lack of insurance  or money.  However, once she has been on Detrol (she was also initially on  Pyridium), her abdominal exam has marked improved.   With mention of the left ovary cyst, the recommendation is that she needs to  have a followup of this in six weeks with a followup ultrasound.  She did  have a urine pregnancy test done here that was negative since she has  reported not having a period for the past five or six weeks.   She had apparently complained of some visual loss symptoms to the admitting  doctor and therefore she had a CT of the head primarily because of a history  of lupus and this was negative for any acute pathology.  She has not had any  other symptoms with this.   Because of her complaints of chest pain, she had cardiac enzymes done.  All  of her CPKs were normal.  In fact, they were all less than 100.  MB  fractions were also normal, however, she had elevated troponins, although  the initial troponin was negative at 0.02.  Her highest troponin value that  we obtained on her was 0.15.  Subsequently she had repeat cardiac panel done  on July 30, 2003, that showed a troponin level back down to normal at 0.01.  Her CK was 30 with an MB of 0.4.  In light of the chest pain that she is  describing mostly as sharp of a very short duration, we went ahead and did a  CT of the chest which was negative for pulmonary embolism.  The official CT  report is still not on the computer, however, we did receive a preliminary  report when I called down and it was certainly negative for pulmonary  embolism.  Subsequently [facility] was consulted and Lyn Records,  M.D., saw the patient because of the elevated troponin and she subsequently   underwent a two-day Cardiolite stress test which was entirely negative aside  from the breast attenuation, but there were no signs of reversible ischemia  or any signs of previous myocardial infarction.  In fact, her EF on this  study was noted at 81% and she had normal left ventricular wall motion  study.  The patient actually also had a 2-D echocardiogram which was  entirely normal with overall left ventricular systolic function being  normal.  The left ventricular ejection fraction was estimated at 55-65%.  There was no left ventricular regional wall motion abnormalities.  There was  mild MR noted.  There was no pericardial infarction.  The inferior vena cava  was normal.  Her right ventricular  wall thickness was also normal.  There  were no abnormalities of the left atrium either.  Therefore, these chest  pains were certainly not cardiac in origin nor suggestive of a pulmonary embolus and most likely reflects just musculoskeletal pain.  Cardiology had  recommended no further cardiac workup.  She was placed on telemetry for a  short duration of time.  On telemetry, she had no arrhythmias.  She was in  sinus rhythm with rates running from 79-94 and no higher.  This was on a  higher dose of Toprol.  She did have a cholesterol panel done as part of  rule out MI protocol and that came back showing a total cholesterol of 111,  triglycerides 163, HDL cholesterol 29, and LDL was 49.  I have just brought  this briefly with the patient and recommend probably niacin or can also  consider Lopid to help with decrease in the triglycerides to less than 150  and rising the HDL.  Her D-dimer was insignificant and it was only at 0.49.   We continued her most of her home medications.  We did add MiraLax to her  regimen for constipation since she was complaining of that and this has  helped with her symptoms.  Over the past less than 24 hours, she has had  some episodes of nausea, however, this is not new  for her and therefore I do  not feel further needs to keep her as an inpatient for a chronic problem  that she normally has.  Once we had removed her PICC line, she complained of  mild nausea, so I have gone ahead and given her Phenergan 25 mg IM x 1.  She  will be given her usual Phenergan dose prescription to take with her at  home.   Because of a previous history of lupus and some of her pains, we went ahead  and repeated her ANA and that was negative.  Her CH50 compliment level was  normal.  Her C3 and C4 compliment levels were also normal.  She also had an  a.m. cortisol level that was normal.  Her urine drug screen was positive for  benzodiazepines and opiates, which we know she has been taking as at home.   The patient is being discharged to home in stable condition.  Her last CBC  done on July 30, 2003, came back showing a white count of 5.4, hemoglobin of  10.6, hematocrit of 31.1, and platelet count of 307.  The differential  was entirely normal.  In regards to her mild anemia, would recommend just  further workup as an outpatient.  This may reflect some of the phlebotomies  that we have had to do here, but would recommend followup by her primary  care physician.                                                Isla Pence, M.D.    RRV/MEDQ  D:  08/01/2003  T:  08/01/2003  Job:  [id number]   cc:   Miguel Aschoff, M.D.  [address]  [place]  [place] [address]  Fax: [phone]

## 2010-05-20 NOTE — Discharge Summary (Signed)
[name], [name]                 ACCOUNT NO.:  [id number]   MEDICAL RECORD NO.:  [id number]          [place] TYPE:  INP   LOCATION:  2630                         FACILITY:  [facility]   PHYSICIAN:  [name], M.D.DATE OF BIRTH:  1965-04-24   DATE OF ADMISSION:  06/13/2007  DATE OF DISCHARGE:  06/17/2007                               DISCHARGE SUMMARY   DISCHARGE DIAGNOSES:  1. Drug overdose with suicidal ideations.  2. Gastric ulcers - clean based, status post biopsy sent for      Helicobacter pylori - [name] is to follow up with Dr. [name]      for results.  3. Probable Candida esophagitis.  4. Atrophic appearing duodenal mucosa - per EGD, status post biopsies      to rule out celiac sprue, [name] to follow up with Dr.      [name].  5. Anemia - secondary to gastrointestinal bleed/blood loss due to      gastric ulcers.  6. Bipolar disorder NOS.  7. History of anxiety.  8. History of lupus.  9. History of rheumatoid arthritis.  10.History of migraine headaches.  11.Acute renal failure - resolved.  12.Hypokalemia - potassium replaced.   PROCEDURE AND STUDIES:  EGD on June 14, 2007, atrophic appearing  duodenal mucosa status post biopsy to rule out celiac sprue.  Distal  clean based gastric ulcers status post biopsies of surrounding mucosa to  check for Helicobacter pylori.  White plaque in Leah esophagus for  Candida esophagitis status post biopsies.  CT scan of abdomen and pelvis  - no acute intracranial abnormalities.   CONSULTATIONS:  1. Psychiatric - Dr. [name].  2. Gastroenterology - Dr. [name].   BRIEF HISTORY:  [name] is a 45 year old white female with Leah above  listed medical problems on high doses of sedative  medications/anxiolytics who presented with altered mental status -  lethargic/somnolent following a drug overdose.  Leah history was obtained  from her husband in Leah ER who reported that she had begun having some  episodes of  diarrhea on Leah day of presentation and felt very weak and  lightheaded.  He reported that she was in Leah bathroom for some time and  following that she took her medications and went to bed and later on in  Leah middle of Leah night she seemed to be gasping for air and so he  brought her to Leah emergency room.  On arrival in Leah emergency room it  was noted that [name] was lethargic, somnolent, her respirations  were laboring.  Her blood pressure on arrival was 82/51 with a pulse of  54 and she was given Narcan which did little to wake her up initially  but gradually she began to improve.  Her urine screen was positive for  benzodiazepines but otherwise negative.  Her lab work reveals a BUN of  24 with a creatinine of 1.6 and her white count was 11 with a hemoglobin  of 14.3.  It was unclear initially if [name] had taken an overdose  of benzodiazepines and if so if  it was intentional.  Later on during her  hospitalization she admitted that she had overdosed on meclizine tablets  which she had been accumulating.  It was unclear Leah exact amount.  She  was admitted for further evaluation and management.   Please see Leah full admission history and physical dictated on June 13, 2007, by Dr. [name] for Leah details of Leah admission, physical exam as  well as Leah laboratory data.   HOSPITAL COURSE:  1. Drug/meclizine overdose with suicidal ideations - [name] was      closely monitored in Leah step-down unit with suicide precautions -      24 hour sitter.  All her sedative medications were held initially      along with supportive care and psychiatry was consulted.  Dr.      [name] saw [name] and initially recommended following her      evaluation to restart her Topamax, discontinue her Xanax and start      her on Ativan detox protocol.  He also recommended that Leah other      psychotropic medications be deferred and that [name] be      admitted to a psychiatric ward  as soon as possible.  [name]      initially agreed to this but subsequently after Leah plan was      discussed with her husband he requested to talk with Dr. [name]      and at that time [name] indicated that she did not want to go      for Leah inpatient program.  Dr. [name] was reconsulted and      following reevaluation he recommended intensive outpatient      psychiatric program and Leah social worker set this up and also for      [name] to be restarted on Zoloft 50 mg daily and Xanax 0.5 to      1 mg t.i.d. p.r.n.  This was done and [name] was discharged      home for outpatient followup.  2. Gastric ulcers/gastrointestinal bleed with anemia - while in Leah      hospital [name] had melenic stools and stool guaiacs were      positive for occult blood.  Her H and H was monitored when it      dropped to a low of 10.6 with a hematocrit of 31.  Gastroenterology      was consulted and Dr. [name] saw [name] and an EGD was done      and Leah results as stated above.  [name] was maintained on      proton pump inhibitor during her hospital stay which she is to      continue upon discharge.  Her hemoglobin upon discharge was 11.2      with a hematocrit of 31.9.  She did not require any blood      transfusion during her hospital stay.  [name] was hypotensive      in Leah hospital and she was aggressively hydrated with IV fluids      and Leah hypotension resolved.  Leah impression was that Leah      hypotension was secondary to Leah gastrointestinal bleed and Leah      drug overdose.  3. Candida esophagitis - as discussed above.  Leah EGD revealed plaques      consistent with Candida and she was empirically started on      Diflucan.  She is to continue this upon discharge and follow up      with Dr. [name] for biopsy results.  4. History of lupus/rheumatoid arthritis - [name] was instructed      to discontinue her Mobic (it was thought to be Leah  etiology of her      gastric ulcers/gastrointestinal bleed) and she is to follow up with      Dr. Coral Spikes, her rheumatologist, for further management.   DISCHARGE MEDICATIONS:  1. Topamax 25 mg one p.o. b.i.d.  2. Xanax 0.5 to 1 mg t.i.d. p.r.n. only.  3. Soma 350 mg b.i.d. p.r.n.  4. Zoloft 50 mg daily and then to increase as directed by Dr.      [name] to 150 mg.  5. [name] is to discontinue Ativan.  6. Also to discontinue meclizine 25 mg p.r.n.  7. She is to continue Phenergan, Lasix, KCl as previously.  8. She is to discontinue Mobic as discussed above.   FOLLOWUP CARE:  1. Intensive outpatient psychiatric program as scheduled per [employer].  2. Dr. Miguel Aschoff in 1-2 weeks.  3. Dr. [name] in 2 weeks, call for appointment.  4. Dr. Coral Spikes, call for appointment.   DISCHARGE CONDITION:  Improved/stable.      [name], M.D.  Electronically Signed     ACV/MEDQ  D:  07/04/2007  T:  07/04/2007  Job:  [id number]   cc:   Miguel Aschoff, M.D.  Shirley Friar, MD  Demetria Pore. Coral Spikes, M.D.

## 2010-05-20 NOTE — Consult Note (Signed)
[name], [name]                           ACCOUNT NO.:  [id number]   MEDICAL RECORD NO.:  [id number]                   PATIENT TYPE:  INP   LOCATION:  4713                                 FACILITY:  [facility]   PHYSICIAN:  Lesleigh Noe, M.D.            DATE OF BIRTH:  1965/07/01   DATE OF CONSULTATION:  07/29/2003  DATE OF DISCHARGE:                                   CONSULTATION   REASON FOR CONSULTATION:  Elevated Troponin enzymes.   CONCLUSION:  1. Mildly elevated troponin cardiac markers x 2 of uncertain etiology.  2. Recurrent atypical chest pain of uncertain etiology.  3. History of lupus erythematosus.  4. Hypertension.  5. History of depression and anxiety.  6. History of seizure disorder.   RECOMMENDATIONS:  Stress Cardiolite will be done on a two-day protocol to  rule out evidence of active ischemia.   COMMENTS:  The patient is 45 years of age and has had recurrent chest  discomfort off and on for many years.  The discomfort is in the substernal  region.  It lasts seconds.  It may occur multiple times in the day.  Occasionally there is shortness of breath associated with it.  There is no  known precipitant.   MEDICATIONS ON ADMISSION:  1. Toprol XL 50 mg daily.  2. Lasix 10 mg to 20 mg each morning.  3. Xanax 2 mg b.i.d.  4. Ativan 2.5 mg q.h.s.  5. Wellbutrin XL 150 mg b.i.d.  6. Flonase.  7. Allegra.  8. Meclizine.   ALLERGIES:  DARVOCET, PLAQUENIL, CODEINE, PENICILLIN.   HABITS:  None known.   SOCIAL HISTORY:  She is a nonsmoker.   PHYSICAL EXAMINATION:  GENERAL:  The patient has on a lot of makeup.  She is  in no acute distress.  She is complaining her chest is currently hurting.  SKIN:  Warm and dry.  Skin color is good.  VITAL SIGNS:  Blood pressure 121/87, heart rate 80.  HEENT:  Unremarkable.  NECK:  No JVD or thyromegaly.  LUNGS:  Clear.  CARDIAC:  No rub, no gallop, no click, no murmur.  EXTREMITIES:  No edema.  ABDOMEN:  Relatively  soft.  BREASTS:  The patient has had breast augmentation.   LABORATORY AND X-RAY DATA:  Troponin on previous determination elevated at  0.15, the highest number for this with normal CK-MB level.   The 2-D echocardiogram is basically normal.   EKG is normal.   Chest x-ray does not reveal any abnormality.                                               Lesleigh Noe, M.D.    HWS/MEDQ  D:  07/30/2003  T:  07/30/2003  Job:  [id number]  cc:   C. Duane Lope, M.D.  [address]  [place]  [place] [address]  Fax: [phone]

## 2010-09-29 LAB — COMPREHENSIVE METABOLIC PANEL
BUN: 24 — ABNORMAL HIGH
CO2: 19
Chloride: 106
Creatinine, Ser: 1.58 — ABNORMAL HIGH
GFR calc non Af Amer: 36 — ABNORMAL LOW
Total Bilirubin: 1.1

## 2010-09-29 LAB — BASIC METABOLIC PANEL
BUN: 10
BUN: 4 — ABNORMAL LOW
CO2: 17 — ABNORMAL LOW
CO2: 17 — ABNORMAL LOW
CO2: 21
Calcium: 7.1 — ABNORMAL LOW
Calcium: 7.7 — ABNORMAL LOW
Chloride: 113 — ABNORMAL HIGH
Creatinine, Ser: 0.97
Creatinine, Ser: 0.72
GFR calc Af Amer: >60
GFR calc Af Amer: >60
GFR calc Af Amer: >60
GFR calc Af Amer: >60
GFR calc non Af Amer: >60
GFR calc non Af Amer: >60
Potassium: 3.1 — ABNORMAL LOW
Potassium: 4
Sodium: 135
Sodium: 137

## 2010-09-29 LAB — CBC
HCT: 31.9 — ABNORMAL LOW
HCT: 42
Hemoglobin: 11.2 — ABNORMAL LOW
MCHC: 34.2
MCV: 98.6
Platelets: 140 — ABNORMAL LOW
Platelets: 198
RBC: 3.12 — ABNORMAL LOW
RBC: 3.3 — ABNORMAL LOW
RBC: 4.26
RDW: 12.3
WBC: 11 — ABNORMAL HIGH
WBC: 8.4

## 2010-09-29 LAB — SALICYLATE LEVEL: Salicylate Lvl: <4

## 2010-09-29 LAB — DIFFERENTIAL
Basophils Absolute: 0.1
Eosinophils Relative: 0
Lymphocytes Relative: 30
Lymphocytes Relative: 9 — ABNORMAL LOW
Lymphs Abs: 2.6
Monocytes Absolute: 0.6
Neutro Abs: 5.2
Neutro Abs: 9.5 — ABNORMAL HIGH
Neutrophils Relative %: 86 — ABNORMAL HIGH

## 2010-09-29 LAB — OCCULT BLOOD X 1 CARD TO LAB, STOOL: Fecal Occult Bld: POSITIVE

## 2010-09-29 LAB — TYPE AND SCREEN: Antibody Screen: NEGATIVE

## 2010-09-29 LAB — HEMOGLOBIN AND HEMATOCRIT, BLOOD
HCT: 33.7 — ABNORMAL LOW
Hemoglobin: 11.8 — ABNORMAL LOW

## 2010-09-29 LAB — RAPID URINE DRUG SCREEN, HOSP PERFORMED
Amphetamines: NOT DETECTED
Barbiturates: NOT DETECTED
Benzodiazepines: POSITIVE — AB
Cocaine: NOT DETECTED
Opiates: NOT DETECTED
Tetrahydrocannabinol: NOT DETECTED

## 2010-09-29 LAB — POCT PREGNANCY, URINE
Operator id: 282201
Preg Test, Ur: NEGATIVE

## 2010-09-29 LAB — POCT CARDIAC MARKERS: Operator id: 282201

## 2010-09-29 LAB — D-DIMER, QUANTITATIVE: D-Dimer, Quant: 0.26

## 2010-09-29 LAB — ACETAMINOPHEN LEVEL: Acetaminophen (Tylenol), Serum: <10 — ABNORMAL LOW

## 2010-09-30 LAB — COMPREHENSIVE METABOLIC PANEL
Albumin: 4
Alkaline Phosphatase: 44
BUN: 10
Chloride: 108
Creatinine, Ser: 1.07
GFR calc non Af Amer: 56 — ABNORMAL LOW
Glucose, Bld: 91
Potassium: 3.3 — ABNORMAL LOW
Total Bilirubin: 0.5

## 2010-09-30 LAB — RAPID URINE DRUG SCREEN, HOSP PERFORMED
Benzodiazepines: POSITIVE — AB
Tetrahydrocannabinol: NOT DETECTED

## 2010-09-30 LAB — CBC
HCT: 37.6
Hemoglobin: 13.3
MCV: 97
Platelets: 231
WBC: 7.2

## 2010-09-30 LAB — DIFFERENTIAL
Basophils Absolute: 0.1
Basophils Relative: 2 — ABNORMAL HIGH
Lymphocytes Relative: 40
Monocytes Absolute: 0.3
Neutro Abs: 3.8
Neutrophils Relative %: 52

## 2010-09-30 LAB — URINALYSIS, ROUTINE W REFLEX MICROSCOPIC
Bilirubin Urine: NEGATIVE
Nitrite: NEGATIVE
Specific Gravity, Urine: 1.008
Urobilinogen, UA: 0.2
pH: 5

## 2010-09-30 LAB — URINE MICROSCOPIC-ADD ON

## 2012-02-26 ENCOUNTER — Encounter (HOSPITAL_BASED_OUTPATIENT_CLINIC_OR_DEPARTMENT_OTHER): Payer: Self-pay | Admitting: *Deleted

## 2012-02-26 ENCOUNTER — Emergency Department (HOSPITAL_BASED_OUTPATIENT_CLINIC_OR_DEPARTMENT_OTHER)
Admission: EM | Admit: 2012-02-26 | Discharge: 2012-02-26 | Disposition: A | Discharge status: Home / Self Care | Payer: Medicaid Other | Attending: Emergency Medicine | Admitting: Emergency Medicine

## 2012-02-26 DIAGNOSIS — F411 Generalized anxiety disorder: Secondary | ICD-10-CM | POA: Insufficient documentation

## 2012-02-26 DIAGNOSIS — IMO0001 Reserved for inherently not codable concepts without codable children: Secondary | ICD-10-CM | POA: Insufficient documentation

## 2012-02-26 DIAGNOSIS — K921 Melena: Secondary | ICD-10-CM | POA: Insufficient documentation

## 2012-02-26 DIAGNOSIS — R5381 Other malaise: Secondary | ICD-10-CM | POA: Insufficient documentation

## 2012-02-26 DIAGNOSIS — Z8742 Personal history of other diseases of the female genital tract: Secondary | ICD-10-CM | POA: Insufficient documentation

## 2012-02-26 DIAGNOSIS — Z8739 Personal history of other diseases of the musculoskeletal system and connective tissue: Secondary | ICD-10-CM | POA: Insufficient documentation

## 2012-02-26 DIAGNOSIS — G40909 Epilepsy, unspecified, not intractable, without status epilepticus: Secondary | ICD-10-CM | POA: Insufficient documentation

## 2012-02-26 HISTORY — DX: Unspecified convulsions: R56.9

## 2012-02-26 HISTORY — DX: Rheumatoid arthritis, unspecified: M06.9

## 2012-02-26 HISTORY — DX: Essential (primary) hypertension: I10

## 2012-02-26 HISTORY — DX: Unspecified osteoarthritis, unspecified site: M19.90

## 2012-02-26 HISTORY — DX: Systemic lupus erythematosus, unspecified: M32.9

## 2012-02-26 HISTORY — DX: Endometriosis, unspecified: N80.9

## 2012-02-26 LAB — TROPONIN I: Troponin I: <0.3 ng/mL (ref <0.30)

## 2012-02-26 LAB — HEPATIC FUNCTION PANEL
AST: 31 U/L (ref 0–37)
Bilirubin, Direct: <0.1 mg/dL (ref 0.0–0.3)
Total Bilirubin: 0.2 mg/dL — ABNORMAL LOW (ref 0.3–1.2)

## 2012-02-26 LAB — BASIC METABOLIC PANEL
BUN: 9 mg/dL (ref 6–23)
Calcium: 9.1 mg/dL (ref 8.4–10.5)
Creatinine, Ser: 0.7 mg/dL (ref 0.50–1.10)
GFR calc Af Amer: >90 mL/min (ref >90)
GFR calc non Af Amer: >90 mL/min (ref >90)
Glucose, Bld: 93 mg/dL (ref 70–99)
Potassium: 4.3 mEq/L (ref 3.5–5.1)

## 2012-02-26 LAB — CBC WITH DIFFERENTIAL/PLATELET
Blasts: 0 %
Eosinophils Absolute: 0.2 10*3/uL (ref 0.0–0.7)
Eosinophils Relative: 3 % (ref 0–5)
HCT: 39.6 % (ref 36.0–46.0)
Lymphocytes Relative: 34 % (ref 12–46)
Lymphs Abs: 2.3 10*3/uL (ref 0.7–4.0)
MCV: 101.5 fL — ABNORMAL HIGH (ref 78.0–100.0)
Metamyelocytes Relative: 0 %
Monocytes Absolute: 0.5 10*3/uL (ref 0.1–1.0)
Monocytes Relative: 8 % (ref 3–12)
Neutro Abs: 3.7 10*3/uL (ref 1.7–7.7)
Platelets: 297 10*3/uL (ref 150–400)
RBC: 3.9 MIL/uL (ref 3.87–5.11)
RDW: 12.1 % (ref 11.5–15.5)
WBC: 6.7 10*3/uL (ref 4.0–10.5)
nRBC: 0 /100 WBC

## 2012-02-26 MED ORDER — HYDROCODONE-ACETAMINOPHEN 5-325 MG PO TABS
1.0000 | ORAL_TABLET | Freq: Four times a day (QID) | ORAL | Status: DC | PRN
Start: 2012-02-26 — End: 2016-05-08

## 2012-02-26 MED ORDER — HYDROCODONE-ACETAMINOPHEN 5-325 MG PO TABS
2.0000 | ORAL_TABLET | Freq: Once | ORAL | Status: AC
  Administered 2012-02-26: 2 via ORAL
  Filled 2012-02-26: qty 2

## 2012-02-26 MED ORDER — ALPRAZOLAM 1 MG PO TABS: 1.0000 mg | ORAL_TABLET | Freq: Every evening | ORAL | Status: DC | PRN

## 2012-02-26 MED ORDER — ALPRAZOLAM 0.5 MG PO TABS
1.0000 mg | ORAL_TABLET | Freq: Once | ORAL | Status: AC
  Administered 2012-02-26: 1 mg via ORAL
  Filled 2012-02-26 (×2): qty 1

## 2012-02-26 MED ORDER — SODIUM CHLORIDE 0.9 % IV BOLUS (SEPSIS): 1000.0000 mL | Freq: Once | INTRAVENOUS | Status: DC

## 2012-02-26 MED ORDER — SODIUM CHLORIDE 0.9 % IV SOLN: Freq: Once | INTRAVENOUS | Status: DC

## 2012-02-26 NOTE — ED Notes (Signed)
Currently denies chest pain c/o abd pain

## 2012-02-26 NOTE — ED Provider Notes (Signed)
History    This chart was scribed for Derwood Kaplan, MD by Donne Anon, ED Scribe. This patient was seen in room MH04/MH04 and the patient's care was started at 1831.   CSN: [id number]  Arrival date & time 02/26/12  1818   First MD Initiated Contact with Patient 02/26/12 1831      Chief Complaint  Patient presents with  . Seizures     The history is provided by the patient. No language interpreter was used.   [name] is a 47 y.o. female who presents to the Emergency Department complaining of seizures. She has extensive medical hx, including chronic problems such as endometriosis, SLE, seizures and anxiety, RA. For the past few days. Patient has been feeling sick. She reports associated racing heart, fatigue, weakness, confusion, and "not feeling herself." She also reports abdominal pain (chronic) and bloody stool (both bright and dark red). The hematochezia has been going on for 1 year and is gradually worsening over the past week. She has had an EGD, and was found to have hemorrhoids. Pt reports she is incontnent and she has to use a self-catheter - this is a complication of endometriosis. On Saturday night she had seizure witnessed by her husband and she bit her lip during this episode. Her husband states that she is currently out of some of her medication.  She does not currently have a PCP.  History reviewed. No pertinent past medical history.  No past surgical history on file.  No family history on file.  History  Substance Use Topics  . Smoking status: Not on file  . Smokeless tobacco: Not on file  . Alcohol Use: Not on file     Review of Systems  Neurological: Positive for seizures.  All other systems reviewed and are negative.    Allergies  Review of patient's allergies indicates not on file.  Home Medications  No current outpatient prescriptions on file.  Triage Vitals; BP 174/95  Pulse 97  Temp(Src) 97.6 F (36.4 C) (Oral)  Resp 20  SpO2  100%  Physical Exam  Nursing note and vitals reviewed. Constitutional: She is oriented to person, place, and time. She appears well-developed and well-nourished. No distress.  HENT:  Head: Normocephalic and atraumatic.  Eyes: EOM are normal.  Neck: Neck supple. No tracheal deviation present.  Cardiovascular: Normal rate, regular rhythm and normal heart sounds.   No murmur heard. Pulmonary/Chest: Effort normal and breath sounds normal. No respiratory distress.  Abdominal: Soft. Bowel sounds are normal. There is tenderness. There is no rebound and no guarding.  Lower quadrants bilaterally pt has tenderness.  Musculoskeletal: Normal range of motion.  Neurological: She is alert and oriented to person, place, and time.  Skin: Skin is warm and dry.  Psychiatric: She has a normal mood and affect. Her behavior is normal.    ED Course  Procedures (including critical care time) DIAGNOSTIC STUDIES: Oxygen Saturation is 100% on room air, norml by my interpretation.    COORDINATION OF CARE: 6:57 PM Discussed treatment plan which includes basic workup with pt at bedside and pt agreed to plan.     Labs Reviewed - No data to display No results found.   No diagnosis found.    MDM  I personally performed the services described in this documentation, which was scribed in my presence. The recorded information has been reviewed and is accurate.   Date: 02/26/2012  Rate: 90  Rhythm: normal sinus rhythm  QRS Axis: normal  Intervals:  normal  ST/T Wave abnormalities: normal  Conduction Disutrbances: none  Narrative Interpretation: unremarkable  Pt comes in with cc of seizures. She is on Xanax for her seizures - as apparently, she only gets seizures when she has panic attack or high stress. No recent infection, trauma. No further workup needed for seizures - as it appears that her running out of xanax has something to with seizures. I dont think she has benzo withdrawals at this time, as  this was just a remote episode.  Pt has several non specific and generalized complains. Most of her complains (bleeding, pain, anxiety) are chronic - with acute worsening as off late. We will get basic workup here, and hydrate. It is tough to discern between organic and psychogenic etiology - as patient herself admits that not having her xanax is making her worse.  Pt also has some hematochezia Chronic. Will check CBC and decide on the plan. Has known hemorrhoids - so even if needs transfusion, will be someone we will transfuse and discharge.   Derwood Kaplan, MD 02/26/12 [phone]

## 2012-02-26 NOTE — ED Notes (Signed)
Pt reports she had seizure Saturday night witnessed by husband- pt has to self-cath and is not sure if she was incontinent- states she is out of her seizure meds

## 2013-01-02 HISTORY — PX: SALPINGECTOMY: SHX328

## 2013-12-02 DIAGNOSIS — A4902 Methicillin resistant Staphylococcus aureus infection, unspecified site: Secondary | ICD-10-CM

## 2013-12-02 HISTORY — DX: Methicillin resistant Staphylococcus aureus infection, unspecified site: A49.02

## 2015-10-03 ENCOUNTER — Emergency Department (HOSPITAL_BASED_OUTPATIENT_CLINIC_OR_DEPARTMENT_OTHER)
Admission: EM | Admit: 2015-10-03 | Discharge: 2015-10-03 | Disposition: A | Discharge status: Home / Self Care | Payer: Medicaid Other | Attending: Emergency Medicine | Admitting: Emergency Medicine

## 2015-10-03 ENCOUNTER — Encounter (HOSPITAL_BASED_OUTPATIENT_CLINIC_OR_DEPARTMENT_OTHER): Payer: Self-pay | Admitting: Emergency Medicine

## 2015-10-03 DIAGNOSIS — I10 Essential (primary) hypertension: Secondary | ICD-10-CM | POA: Insufficient documentation

## 2015-10-03 DIAGNOSIS — F17200 Nicotine dependence, unspecified, uncomplicated: Secondary | ICD-10-CM | POA: Diagnosis not present

## 2015-10-03 DIAGNOSIS — S0180XA Unspecified open wound of other part of head, initial encounter: Secondary | ICD-10-CM | POA: Diagnosis present

## 2015-10-03 DIAGNOSIS — Z859 Personal history of malignant neoplasm, unspecified: Secondary | ICD-10-CM | POA: Diagnosis not present

## 2015-10-03 DIAGNOSIS — X58XXXA Exposure to other specified factors, initial encounter: Secondary | ICD-10-CM | POA: Diagnosis not present

## 2015-10-03 DIAGNOSIS — Y999 Unspecified external cause status: Secondary | ICD-10-CM | POA: Diagnosis not present

## 2015-10-03 DIAGNOSIS — Z79899 Other long term (current) drug therapy: Secondary | ICD-10-CM | POA: Diagnosis not present

## 2015-10-03 DIAGNOSIS — T148XXA Other injury of unspecified body region, initial encounter: Secondary | ICD-10-CM

## 2015-10-03 DIAGNOSIS — Y929 Unspecified place or not applicable: Secondary | ICD-10-CM | POA: Diagnosis not present

## 2015-10-03 DIAGNOSIS — Y939 Activity, unspecified: Secondary | ICD-10-CM | POA: Diagnosis not present

## 2015-10-03 DIAGNOSIS — N3000 Acute cystitis without hematuria: Secondary | ICD-10-CM | POA: Insufficient documentation

## 2015-10-03 HISTORY — DX: Methicillin resistant Staphylococcus aureus infection, unspecified site: A49.02

## 2015-10-03 HISTORY — DX: Encounter for other specified aftercare: Z51.89

## 2015-10-03 LAB — URINE MICROSCOPIC-ADD ON
RBC / HPF: NONE SEEN RBC/hpf (ref 0–5)
Squamous Epithelial / LPF: NONE SEEN

## 2015-10-03 LAB — URINALYSIS, ROUTINE W REFLEX MICROSCOPIC
Bilirubin Urine: NEGATIVE
Glucose, UA: NEGATIVE mg/dL
Hgb urine dipstick: NEGATIVE
KETONES UR: NEGATIVE mg/dL
NITRITE: NEGATIVE
PH: 7 (ref 5.0–8.0)
Protein, ur: NEGATIVE mg/dL
SPECIFIC GRAVITY, URINE: 1.003 — AB (ref 1.005–1.030)

## 2015-10-03 MED ORDER — CEPHALEXIN 500 MG PO CAPS: 500.0000 mg | ORAL_CAPSULE | Freq: Four times a day (QID) | ORAL | 0 refills | Status: DC

## 2015-10-03 NOTE — Discharge Instructions (Signed)
Please take antibiotics as directed, please follow-up with dermatology as previously scheduled.

## 2015-10-03 NOTE — ED Triage Notes (Signed)
Pt h/o metasticized ovarian cancer and lupus and states for past several weeks has had issues with skin infections.  States skin on forehead blisteres, then cracks open.  Pt lasted treated with IV anitbiotics at [facility] 2 weeks ago.  Pt concerned that she needs additional antibioitcs to treat infection.  Multiple open laceration type wounds noted to pt forehead, mild bleeding and weeping noted in triage.Marland Kitchen

## 2015-10-03 NOTE — ED Provider Notes (Signed)
[place] DEPT [facility] Provider Note   CSN: [id number] Arrival date & time: 10/03/15  1107     History   Chief Complaint Chief Complaint  Patient presents with  . Facial Pain  . Wound Infection    HPI [name] is a 50 y.o. female.  HPI   50 year old female presents today with complaints of facial pain and wounds to her forehead. Patient reports intermittent history of wounds to her forehead face and lower extremity is. She reports symptoms started several weeks ago, with swelling and bleeding. She notes she's been seen numerous times and has a follow-up appointment with the dermatologist. She reports she's received antibiotics in the past which have improved symptoms and is requesting  antibiotics. She denies any fevers, nausea vomiting, or any other wounds other than on the forehead.  Past Medical History:  Diagnosis Date  . Arthritis   . Blood transfusion without reported diagnosis   . Cancer ([place])   . Endometriosis   . Hypertension   . Lupus   . MRSA (methicillin resistant Staphylococcus aureus) 12/2013  . Ovary, ovarian pedicle and fallopian tube torsion   . RA (rheumatoid arthritis) ([place])   . Seizure ([place])     There are no active problems to display for this patient.   Past Surgical History:  Procedure Laterality Date  . ABDOMINAL HYSTERECTOMY    . APPENDECTOMY    . HYSTEROSCOPY W/ ENDOMETRIAL ABLATION    . LYMPH NODE DISSECTION    . OOPHORECTOMY      OB History    No data available      Home Medications    Prior to Admission medications   Medication Sig Start Date End Date Taking? Authorizing Provider  albuterol (PROVENTIL HFA;VENTOLIN HFA) 108 (90 BASE) MCG/ACT inhaler Inhale 2 puffs into the lungs every 6 (six) hours as needed for wheezing.    Historical Provider, MD  ALPRAZolam Duanne Moron) 1 MG tablet Take 1 tablet (1 mg total) by mouth at bedtime as needed for sleep. 02/26/12   Varney Biles, MD  alprazolam Duanne Moron) 2 MG tablet Take 2 mg by  mouth 3 (three) times daily as needed for sleep.    Historical Provider, MD  carisoprodol (SOMA) 350 MG tablet Take 350 mg by mouth 4 (four) times daily as needed for muscle spasms.    Historical Provider, MD  cephALEXin (KEFLEX) 500 MG capsule Take 1 capsule (500 mg total) by mouth 4 (four) times daily. 10/03/15   Okey Regal, PA-C  dicyclomine (BENTYL) 20 MG tablet Take 20 mg by mouth 3 (three) times daily as needed.    Historical Provider, MD  fluticasone (FLONASE) 50 MCG/ACT nasal spray Place 2 sprays into the nose daily.    Historical Provider, MD  HYDROcodone-acetaminophen (LORTAB) 7.5-500 MG per tablet Take 1 tablet by mouth 3 (three) times daily as needed for pain.    Historical Provider, MD  HYDROcodone-acetaminophen (NORCO/VICODIN) 5-325 MG per tablet Take 1 tablet by mouth every 6 (six) hours as needed for pain. 02/26/12   Varney Biles, MD  hydrocortisone (ANUSOL-HC) 2.5 % rectal cream Place 1 application rectally 2 (two) times daily.    Historical Provider, MD  hydrocortisone (ANUSOL-HC) 25 MG suppository Place 25 mg rectally 2 (two) times daily.    Historical Provider, MD  Lidocaine-Hydrocortisone Ace 3-2.5 % KIT Place rectally.    Historical Provider, MD  LORazepam (ATIVAN) 2 MG tablet Take 2 mg by mouth at bedtime as needed for anxiety.    Historical Provider,  MD  meclizine (ANTIVERT) 25 MG tablet Take 25 mg by mouth 3 (three) times daily as needed.    Historical Provider, MD  OMEGA 3 1000 MG CAPS Take 2 capsules by mouth daily.    Historical Provider, MD  omeprazole (PRILOSEC) 20 MG capsule Take 20 mg by mouth daily.    Historical Provider, MD  Prenatal Vit-Fe Fumarate-FA (PRENAPLUS) 27-1 MG TABS Take 1 tablet by mouth daily.    Historical Provider, MD  promethazine (PHENERGAN) 25 MG tablet Take 25 mg by mouth every 8 (eight) hours as needed for nausea.    Historical Provider, MD  sertraline (ZOLOFT) 100 MG tablet Take 100 mg by mouth 2 (two) times daily.    Historical Provider, MD   Tamsulosin HCl (FLOMAX) 0.4 MG CAPS Take 0.4 mg by mouth daily.    Historical Provider, MD  traMADol (ULTRAM) 50 MG tablet Take 50 mg by mouth every 8 (eight) hours as needed for pain.    Historical Provider, MD  tretinoin (RETIN-A) 0.025 % cream Apply topically at bedtime.    Historical Provider, MD  vitamin E 100 UNIT capsule Take 100 Units by mouth daily.    Historical Provider, MD  zolpidem (AMBIEN) 5 MG tablet Take 5 mg by mouth at bedtime as needed for sleep.    Historical Provider, MD    Family History No family history on file.  Social History Social History  Substance Use Topics  . Smoking status: Current Every Day Smoker  . Smokeless tobacco: Never Used  . Alcohol use No     Allergies   Plaquenil [hydroxychloroquine sulfate]   Review of Systems Review of Systems  All other systems reviewed and are negative.  Physical Exam Updated Vital Signs BP 133/88 (BP Location: Right Arm)   Pulse 79   Temp 98 F (36.7 C) (Oral)   Resp 18   LMP 01/22/2012   SpO2 100%   Physical Exam  Constitutional: She is oriented to person, place, and time. She appears well-developed and well-nourished.  HENT:  Head: Normocephalic and atraumatic.  Numerous open wounds to the forehead, linear in nature no significant swelling discharge or surrounding cellulitis  Eyes: Conjunctivae are normal. Pupils are equal, round, and reactive to light. Right eye exhibits no discharge. Left eye exhibits no discharge. No scleral icterus.  Neck: Normal range of motion. No JVD present. No tracheal deviation present.  Pulmonary/Chest: Effort normal. No stridor.  Neurological: She is alert and oriented to person, place, and time. Coordination normal.  Psychiatric: She has a normal mood and affect. Her behavior is normal. Judgment and thought content normal.  Nursing note and vitals reviewed.    ED Treatments / Results  Labs (all labs ordered are listed, but only abnormal results are displayed) Labs  Reviewed  URINALYSIS, ROUTINE W REFLEX MICROSCOPIC (NOT AT [facility]) - Abnormal; Notable for the following:       Result Value   Specific Gravity, Urine 1.003 (*)    Leukocytes, UA LARGE (*)    All other components within normal limits  URINE MICROSCOPIC-ADD ON - Abnormal; Notable for the following:    Bacteria, UA RARE (*)    All other components within normal limits  URINE CULTURE    EKG  EKG Interpretation None       Radiology No results found.  Procedures Procedures (including critical care time)  Medications Ordered in ED Medications - No data to display   Initial Impression / Assessment and Plan / ED Course  I have reviewed the triage vital signs and the nursing notes.  Pertinent labs & imaging results that were available during my care of the patient were reviewed by me and considered in my medical decision making (see chart for details).  Clinical Course     Final Clinical Impressions(s) / ED Diagnoses   Final diagnoses:  Open wound  Acute cystitis without hematuria    Labs:  Imaging:  Consults:  Therapeutics:  Discharge Meds:   Assessment/Plan:  50 year old female presents today with complaints of facial wounds. These appear to be traumatic in nature for self-inflicted. She has no signs of surrounding infection. Patient is requesting antibiotics, with numerous areas open I find this reasonable to prevent any infectious etiology. Patient also reports urinary complaints, intermittent self cathing.  Patient will be covered with Keflex that will likely cover urinary tract infection and prevent secondary infection of facial wounds.   New Prescriptions New Prescriptions   CEPHALEXIN (KEFLEX) 500 MG CAPSULE    Take 1 capsule (500 mg total) by mouth 4 (four) times daily.     Okey Regal, PA-C 10/03/15 [place], MD 10/03/15 1430

## 2015-10-06 LAB — URINE CULTURE: Culture: 40000 — AB

## 2015-10-07 ENCOUNTER — Telehealth (HOSPITAL_COMMUNITY): Payer: Self-pay

## 2015-10-07 NOTE — Telephone Encounter (Signed)
Post ED Visit - Positive Culture Follow-up  Culture report reviewed by antimicrobial stewardship pharmacist:  []  Elenor Quinones, Pharm.D. []  Heide Guile, Pharm.D., BCPS []  Parks Neptune, Pharm.D. []  Alycia Rossetti, Pharm.D., BCPS []  Lena, Pharm.D., BCPS, AAHIVP []  Legrand Como, Pharm.D., BCPS, AAHIVP []  Milus Glazier, Pharm.D. []  Stephens November, Pharm.D. Bonnee Quin, Pharm.D.  Positive urine culture, 40,000 colonies -> E Coli Treated with Cephalexin, organism sensitive to the same and no further patient follow-up is required at this time.  Dortha Kern 10/07/2015, 10:47 AM

## 2016-04-14 DIAGNOSIS — D509 Iron deficiency anemia, unspecified: Secondary | ICD-10-CM | POA: Insufficient documentation

## 2016-05-05 ENCOUNTER — Emergency Department (HOSPITAL_BASED_OUTPATIENT_CLINIC_OR_DEPARTMENT_OTHER): Payer: Medicaid Other

## 2016-05-05 ENCOUNTER — Emergency Department (HOSPITAL_BASED_OUTPATIENT_CLINIC_OR_DEPARTMENT_OTHER)
Admission: EM | Admit: 2016-05-05 | Discharge: 2016-05-05 | Disposition: A | Discharge status: Home / Self Care | Payer: Medicaid Other | Attending: Emergency Medicine | Admitting: Emergency Medicine

## 2016-05-05 ENCOUNTER — Encounter (HOSPITAL_BASED_OUTPATIENT_CLINIC_OR_DEPARTMENT_OTHER): Payer: Self-pay | Admitting: *Deleted

## 2016-05-05 DIAGNOSIS — R110 Nausea: Secondary | ICD-10-CM | POA: Diagnosis not present

## 2016-05-05 DIAGNOSIS — F13230 Sedative, hypnotic or anxiolytic dependence with withdrawal, uncomplicated: Secondary | ICD-10-CM | POA: Diagnosis not present

## 2016-05-05 DIAGNOSIS — R39198 Other difficulties with micturition: Secondary | ICD-10-CM | POA: Insufficient documentation

## 2016-05-05 DIAGNOSIS — F13930 Sedative, hypnotic or anxiolytic use, unspecified with withdrawal, uncomplicated: Secondary | ICD-10-CM

## 2016-05-05 DIAGNOSIS — R1084 Generalized abdominal pain: Secondary | ICD-10-CM | POA: Diagnosis not present

## 2016-05-05 DIAGNOSIS — F17200 Nicotine dependence, unspecified, uncomplicated: Secondary | ICD-10-CM | POA: Diagnosis not present

## 2016-05-05 DIAGNOSIS — R197 Diarrhea, unspecified: Secondary | ICD-10-CM | POA: Diagnosis not present

## 2016-05-05 DIAGNOSIS — R509 Fever, unspecified: Secondary | ICD-10-CM | POA: Diagnosis not present

## 2016-05-05 DIAGNOSIS — G479 Sleep disorder, unspecified: Secondary | ICD-10-CM | POA: Insufficient documentation

## 2016-05-05 DIAGNOSIS — I10 Essential (primary) hypertension: Secondary | ICD-10-CM | POA: Insufficient documentation

## 2016-05-05 LAB — URINALYSIS, ROUTINE W REFLEX MICROSCOPIC
Bilirubin Urine: NEGATIVE
GLUCOSE, UA: NEGATIVE mg/dL
Hgb urine dipstick: NEGATIVE
KETONES UR: NEGATIVE mg/dL
LEUKOCYTES UA: NEGATIVE
NITRITE: NEGATIVE
PROTEIN: NEGATIVE mg/dL
Specific Gravity, Urine: 1.008 (ref 1.005–1.030)
pH: 6 (ref 5.0–8.0)

## 2016-05-05 LAB — CBC WITH DIFFERENTIAL/PLATELET
BASOS ABS: 0 10*3/uL (ref 0.0–0.1)
BASOS PCT: 1 %
EOS ABS: 0.2 10*3/uL (ref 0.0–0.7)
EOS PCT: 3 %
HCT: 33.2 % — ABNORMAL LOW (ref 36.0–46.0)
Hemoglobin: 10.8 g/dL — ABNORMAL LOW (ref 12.0–15.0)
Lymphocytes Relative: 33 %
Lymphs Abs: 2.2 10*3/uL (ref 0.7–4.0)
MCH: 30.2 pg (ref 26.0–34.0)
MCHC: 32.5 g/dL (ref 30.0–36.0)
MCV: 92.7 fL (ref 78.0–100.0)
Monocytes Absolute: 0.7 10*3/uL (ref 0.1–1.0)
Monocytes Relative: 11 %
Neutro Abs: 3.6 10*3/uL (ref 1.7–7.7)
Neutrophils Relative %: 52 %
PLATELETS: 277 10*3/uL (ref 150–400)
RBC: 3.58 MIL/uL — ABNORMAL LOW (ref 3.87–5.11)
RDW: 14.5 % (ref 11.5–15.5)
WBC: 6.7 10*3/uL (ref 4.0–10.5)

## 2016-05-05 LAB — COMPREHENSIVE METABOLIC PANEL
ALT: 20 U/L (ref 14–54)
AST: 31 U/L (ref 15–41)
Albumin: 4.3 g/dL (ref 3.5–5.0)
Alkaline Phosphatase: 84 U/L (ref 38–126)
Anion gap: 10 (ref 5–15)
BILIRUBIN TOTAL: 0.5 mg/dL (ref 0.3–1.2)
BUN: 14 mg/dL (ref 6–20)
CALCIUM: 9 mg/dL (ref 8.9–10.3)
CO2: 23 mmol/L (ref 22–32)
CREATININE: 0.68 mg/dL (ref 0.44–1.00)
Chloride: 103 mmol/L (ref 101–111)
Glucose, Bld: 100 mg/dL — ABNORMAL HIGH (ref 65–99)
Potassium: 3.8 mmol/L (ref 3.5–5.1)
Sodium: 136 mmol/L (ref 135–145)
TOTAL PROTEIN: 7.4 g/dL (ref 6.5–8.1)

## 2016-05-05 LAB — LIPASE, BLOOD: LIPASE: 24 U/L (ref 11–51)

## 2016-05-05 MED ORDER — ONDANSETRON HCL 4 MG/2ML IJ SOLN: 4.0000 mg | Freq: Once | INTRAMUSCULAR | Status: DC

## 2016-05-05 MED ORDER — FENTANYL CITRATE (PF) 100 MCG/2ML IJ SOLN: 100.0000 ug | Freq: Once | INTRAMUSCULAR | Status: DC

## 2016-05-05 MED ORDER — ALPRAZOLAM 2 MG PO TABS: 2.0000 mg | ORAL_TABLET | Freq: Three times a day (TID) | ORAL | 0 refills | Status: DC | PRN

## 2016-05-05 MED ORDER — ONDANSETRON 4 MG PO TBDP
4.0000 mg | ORAL_TABLET | Freq: Once | ORAL | Status: AC
  Administered 2016-05-05: 4 mg via ORAL
  Filled 2016-05-05: qty 1

## 2016-05-05 MED ORDER — SODIUM CHLORIDE 0.9 % IV SOLN: Freq: Once | INTRAVENOUS | Status: DC

## 2016-05-05 MED ORDER — IOPAMIDOL (ISOVUE-300) INJECTION 61%
100.0000 mL | Freq: Once | INTRAVENOUS | Status: AC | PRN
  Administered 2016-05-05: 100 mL via INTRAVENOUS

## 2016-05-05 MED ORDER — ALPRAZOLAM 0.5 MG PO TABS
2.0000 mg | ORAL_TABLET | Freq: Once | ORAL | Status: AC
  Administered 2016-05-05: 2 mg via ORAL
  Filled 2016-05-05: qty 4

## 2016-05-05 MED ORDER — HYDROMORPHONE HCL 1 MG/ML IJ SOLN
2.0000 mg | Freq: Once | INTRAMUSCULAR | Status: AC
  Administered 2016-05-05: 2 mg via INTRAMUSCULAR
  Filled 2016-05-05: qty 2

## 2016-05-05 MED FILL — ALPRAZolam 2 MG TABS: 2 | 3 days supply | Qty: 9 | Fill #0

## 2016-05-05 NOTE — ED Triage Notes (Signed)
Pt c/o abdominal pain x2wks with fever, states has a mass to lt side of abdominal

## 2016-05-05 NOTE — ED Notes (Signed)
Attempted 3 IV sticks without success

## 2016-05-05 NOTE — ED Notes (Signed)
Attempted port access twice without success

## 2016-05-05 NOTE — ED Provider Notes (Signed)
[place] [facility] Provider Note   CSN: [id number] Arrival date & time: 05/05/16  0147     History   Chief Complaint Chief Complaint  Patient presents with  . Abdominal Pain    HPI [name] is a 51 y.o. female.  HPI Patient presents with ongoing chronic abdominal pain, nausea and diarrhea. She also had low-grade fever yesterday evening. She took her last dose of hydrocodone and Xanax yesterday evening. Has had increasing agitation. Patient self caths and states she has abdominal fullness. She has a history of ovarian cancer status post resection. She has an appointment with oncology on Monday. She was unable to fill her monthly supply of hydrocodone and Xanax this month. She has an appointment with her primary Dr. next week. Past Medical History:  Diagnosis Date  . Arthritis   . Blood transfusion without reported diagnosis   . Cancer ([place])   . Endometriosis   . Hypertension   . Lupus   . MRSA (methicillin resistant Staphylococcus aureus) 12/2013  . Ovary, ovarian pedicle and fallopian tube torsion   . RA (rheumatoid arthritis) ([place])   . Seizure ([place])     There are no active problems to display for this patient.   Past Surgical History:  Procedure Laterality Date  . ABDOMINAL HYSTERECTOMY    . APPENDECTOMY    . HYSTEROSCOPY W/ ENDOMETRIAL ABLATION    . LYMPH NODE DISSECTION    . OOPHORECTOMY      OB History    No data available       Home Medications    Prior to Admission medications   Medication Sig Start Date End Date Taking? Authorizing Provider  albuterol (PROVENTIL HFA;VENTOLIN HFA) 108 (90 BASE) MCG/ACT inhaler Inhale 2 puffs into the lungs every 6 (six) hours as needed for wheezing.    Historical Provider, MD  alprazolam Duanne Moron) 2 MG tablet Take 1 tablet (2 mg total) by mouth 3 (three) times daily as needed for anxiety. 05/05/16   Julianne Rice, MD  carisoprodol (SOMA) 350 MG tablet Take 350 mg by mouth 4 (four) times daily as needed for  muscle spasms.    Historical Provider, MD  cephALEXin (KEFLEX) 500 MG capsule Take 1 capsule (500 mg total) by mouth 4 (four) times daily. 10/03/15   Okey Regal, PA-C  dicyclomine (BENTYL) 20 MG tablet Take 20 mg by mouth 3 (three) times daily as needed.    Historical Provider, MD  fluticasone (FLONASE) 50 MCG/ACT nasal spray Place 2 sprays into the nose daily.    Historical Provider, MD  HYDROcodone-acetaminophen (LORTAB) 7.5-500 MG per tablet Take 1 tablet by mouth 3 (three) times daily as needed for pain.    Historical Provider, MD  HYDROcodone-acetaminophen (NORCO/VICODIN) 5-325 MG per tablet Take 1 tablet by mouth every 6 (six) hours as needed for pain. 02/26/12   Varney Biles, MD  hydrocortisone (ANUSOL-HC) 2.5 % rectal cream Place 1 application rectally 2 (two) times daily.    Historical Provider, MD  hydrocortisone (ANUSOL-HC) 25 MG suppository Place 25 mg rectally 2 (two) times daily.    Historical Provider, MD  Lidocaine-Hydrocortisone Ace 3-2.5 % KIT Place rectally.    Historical Provider, MD  LORazepam (ATIVAN) 2 MG tablet Take 2 mg by mouth at bedtime as needed for anxiety.    Historical Provider, MD  meclizine (ANTIVERT) 25 MG tablet Take 25 mg by mouth 3 (three) times daily as needed.    Historical Provider, MD  OMEGA 3 1000 MG CAPS Take 2  capsules by mouth daily.    Historical Provider, MD  omeprazole (PRILOSEC) 20 MG capsule Take 20 mg by mouth daily.    Historical Provider, MD  Prenatal Vit-Fe Fumarate-FA (PRENAPLUS) 27-1 MG TABS Take 1 tablet by mouth daily.    Historical Provider, MD  promethazine (PHENERGAN) 25 MG tablet Take 25 mg by mouth every 8 (eight) hours as needed for nausea.    Historical Provider, MD  sertraline (ZOLOFT) 100 MG tablet Take 100 mg by mouth 2 (two) times daily.    Historical Provider, MD  Tamsulosin HCl (FLOMAX) 0.4 MG CAPS Take 0.4 mg by mouth daily.    Historical Provider, MD  traMADol (ULTRAM) 50 MG tablet Take 50 mg by mouth every 8 (eight) hours  as needed for pain.    Historical Provider, MD  tretinoin (RETIN-A) 0.025 % cream Apply topically at bedtime.    Historical Provider, MD  vitamin E 100 UNIT capsule Take 100 Units by mouth daily.    Historical Provider, MD  zolpidem (AMBIEN) 5 MG tablet Take 5 mg by mouth at bedtime as needed for sleep.    Historical Provider, MD    Family History No family history on file.  Social History Social History  Substance Use Topics  . Smoking status: Current Every Day Smoker  . Smokeless tobacco: Never Used  . Alcohol use No     Allergies   Plaquenil [hydroxychloroquine sulfate]   Review of Systems Review of Systems  Constitutional: Positive for fever. Negative for chills.  HENT: Negative for sinus pain and sore throat.   Respiratory: Negative for cough and shortness of breath.   Gastrointestinal: Positive for abdominal distention, abdominal pain, diarrhea and nausea. Negative for constipation and vomiting.  Genitourinary: Positive for difficulty urinating. Negative for flank pain, frequency, hematuria and pelvic pain.  Musculoskeletal: Negative for back pain, myalgias, neck pain and neck stiffness.  Skin: Negative for rash and wound.  Neurological: Negative for dizziness, weakness, light-headedness and headaches.  Psychiatric/Behavioral: Positive for agitation and sleep disturbance. The patient is nervous/anxious.   All other systems reviewed and are negative.    Physical Exam Updated Vital Signs BP 133/89 (BP Location: Right Arm)   Pulse (!) 101   Temp 98 F (36.7 C) (Oral)   Resp 18   Ht '5\' 7"'$  (1.702 m)   Wt 180 lb (81.6 kg)   LMP 01/22/2012   SpO2 100%   BMI 28.19 kg/m   Physical Exam  Constitutional: She is oriented to person, place, and time. She appears well-developed and well-nourished.  Hyperactive. Continuously moving. Scratching at face.  HENT:  Head: Normocephalic and atraumatic.  Mouth/Throat: Oropharynx is clear and moist.  Eyes: EOM are normal. Pupils  are equal, round, and reactive to light.  Neck: Normal range of motion. Neck supple.  Cardiovascular: Normal rate and regular rhythm.  Exam reveals no gallop and no friction rub.   No murmur heard. Pulmonary/Chest: Effort normal and breath sounds normal.  Abdominal: Soft. Bowel sounds are normal. She exhibits no distension and no mass. There is no tenderness. There is no rebound and no guarding. No hernia.  Musculoskeletal: Normal range of motion. She exhibits no edema or tenderness.  Neurological: She is alert and oriented to person, place, and time.  Moving all extremities without deficit. Sensation intact.  Skin: Skin is warm and dry. No rash noted. No erythema.  Multiple of crusted lesions to face. No evidence of infection.  Psychiatric: She has a normal mood and affect. Her  behavior is normal.  Anxious and mildly agitated.   Nursing note and vitals reviewed.    ED Treatments / Results  Labs (all labs ordered are listed, but only abnormal results are displayed) Labs Reviewed  COMPREHENSIVE METABOLIC PANEL - Abnormal; Notable for the following:       Result Value   Glucose, Bld 100 (*)    All other components within normal limits  CBC WITH DIFFERENTIAL/PLATELET - Abnormal; Notable for the following:    RBC 3.58 (*)    Hemoglobin 10.8 (*)    HCT 33.2 (*)    All other components within normal limits  LIPASE, BLOOD  URINALYSIS, ROUTINE W REFLEX MICROSCOPIC    EKG  EKG Interpretation None       Radiology Ct Abdomen Pelvis W Contrast  Result Date: 05/05/2016 CLINICAL DATA:  Abdominal pain for 2 days. EXAM: CT ABDOMEN AND PELVIS WITH CONTRAST TECHNIQUE: Multidetector CT imaging of the abdomen and pelvis was performed using the standard protocol following bolus administration of intravenous contrast. CONTRAST:  ISOVUE-300 IOPAMIDOL (ISOVUE-300) INJECTION 61% COMPARISON:  12/16/2015, 09/17/2015. FINDINGS: Lower chest: No acute abnormality. Hepatobiliary: No focal liver  abnormality is seen. No gallstones, gallbladder wall thickening, or biliary dilatation. Pancreas: Unremarkable. No pancreatic ductal dilatation or surrounding inflammatory changes. Spleen: Normal in size without focal abnormality. Adrenals/Urinary Tract: Adrenal glands are unremarkable. Kidneys are normal, without renal calculi, focal lesion, or hydronephrosis. Bladder is unremarkable. Stomach/Bowel: Small hiatal hernia. Stomach is otherwise within normal limits. Appendectomy. Colon appears normal. Enteric contrast has reached the distal descending colon. No bowel obstruction, perforation or inflammation. Vascular/Lymphatic: No significant vascular findings are present. No enlarged abdominal or pelvic lymph nodes. Reproductive: Uterus and right ovary are normal.  No adnexal masses. Other: No focal inflammation.  No ascites. Musculoskeletal: No significant skeletal lesion. IMPRESSION: Small hiatal hernia. No acute findings. No evidence of active neoplastic disease in the abdomen or pelvis. Electronically Signed   By: Ellery Plunk M.D.   On: 05/05/2016 06:54    Procedures Procedures (including critical care time)  Medications Ordered in ED Medications  0.9 %  sodium chloride infusion (not administered)  ondansetron (ZOFRAN-ODT) disintegrating tablet 4 mg (4 mg Oral Given 05/05/16 0516)  HYDROmorphone (DILAUDID) injection 2 mg (2 mg Intramuscular Given 05/05/16 0516)  iopamidol (ISOVUE-300) 61 % injection 100 mL (100 mLs Intravenous Contrast Given 05/05/16 0625)  ALPRAZolam Prudy Feeler) tablet 2 mg (2 mg Oral Given 05/05/16 0736)     Initial Impression / Assessment and Plan / ED Course  I have reviewed the triage vital signs and the nursing notes.  Pertinent labs & imaging results that were available during my care of the patient were reviewed by me and considered in my medical decision making (see chart for details).     Delay in care due to difficult IV access. Labs, CT without abnormality. Previous  mass seen on [facility] last month is not redemonstrated on today's CT scan. Abdominal exam is benign. Given dose of Xanax in the emergency department. Symptoms likely related to withdrawal and her chronic pain. We'll get short supply of Xanax until she can see her primary physician for refill. She has an appointment with oncology on Monday. Return precautions given.  Final Clinical Impressions(s) / ED Diagnoses   Final diagnoses:  Generalized abdominal pain  Benzodiazepine withdrawal without complication [facility])    New Prescriptions Discharge Medication List as of 05/05/2016  7:28 AM       Loren Racer, MD 05/05/16  0748  

## 2016-05-05 NOTE — ED Notes (Signed)
ED Provider at bedside. 

## 2016-05-05 NOTE — ED Notes (Addendum)
C/O of being anxious. Per husband has not had her 2mg  xanax since last night  around suppertime

## 2016-05-08 ENCOUNTER — Ambulatory Visit (HOSPITAL_BASED_OUTPATIENT_CLINIC_OR_DEPARTMENT_OTHER): Payer: Medicaid Other

## 2016-05-08 ENCOUNTER — Ambulatory Visit (HOSPITAL_BASED_OUTPATIENT_CLINIC_OR_DEPARTMENT_OTHER): Payer: Medicaid Other | Admitting: Hematology & Oncology

## 2016-05-08 ENCOUNTER — Ambulatory Visit: Payer: Medicaid Other

## 2016-05-08 ENCOUNTER — Other Ambulatory Visit (HOSPITAL_BASED_OUTPATIENT_CLINIC_OR_DEPARTMENT_OTHER): Payer: Medicaid Other

## 2016-05-08 VITALS — BP 97/64 | HR 117 | Temp 98.0°F | Resp 18 | Wt 168.1 lb

## 2016-05-08 DIAGNOSIS — M3219 Other organ or system involvement in systemic lupus erythematosus: Secondary | ICD-10-CM

## 2016-05-08 DIAGNOSIS — Z8543 Personal history of malignant neoplasm of ovary: Secondary | ICD-10-CM

## 2016-05-08 DIAGNOSIS — I10 Essential (primary) hypertension: Secondary | ICD-10-CM | POA: Diagnosis not present

## 2016-05-08 DIAGNOSIS — G8928 Other chronic postprocedural pain: Secondary | ICD-10-CM

## 2016-05-08 DIAGNOSIS — C5700 Malignant neoplasm of unspecified fallopian tube: Secondary | ICD-10-CM | POA: Insufficient documentation

## 2016-05-08 DIAGNOSIS — C569 Malignant neoplasm of unspecified ovary: Secondary | ICD-10-CM

## 2016-05-08 DIAGNOSIS — D509 Iron deficiency anemia, unspecified: Secondary | ICD-10-CM

## 2016-05-08 DIAGNOSIS — M329 Systemic lupus erythematosus, unspecified: Secondary | ICD-10-CM | POA: Insufficient documentation

## 2016-05-08 DIAGNOSIS — R109 Unspecified abdominal pain: Secondary | ICD-10-CM | POA: Diagnosis not present

## 2016-05-08 LAB — CBC WITH DIFFERENTIAL (CANCER CENTER ONLY)
BASO#: 0.1 10*3/uL (ref 0.0–0.2)
BASO%: 0.9 % (ref 0.0–2.0)
EOS ABS: 0.2 10*3/uL (ref 0.0–0.5)
EOS%: 3.2 % (ref 0.0–7.0)
HEMATOCRIT: 33.8 % — AB (ref 34.8–46.6)
HGB: 10.9 g/dL — ABNORMAL LOW (ref 11.6–15.9)
LYMPH#: 2.5 10*3/uL (ref 0.9–3.3)
LYMPH%: 37.7 % (ref 14.0–48.0)
MCH: 30.4 pg (ref 26.0–34.0)
MCHC: 32.2 g/dL (ref 32.0–36.0)
MCV: 94 fL (ref 81–101)
MONO#: 0.5 10*3/uL (ref 0.1–0.9)
MONO%: 7.5 % (ref 0.0–13.0)
NEUT%: 50.7 % (ref 39.6–80.0)
NEUTROS ABS: 3.3 10*3/uL (ref 1.5–6.5)
Platelets: 281 10*3/uL (ref 145–400)
RBC: 3.59 10*6/uL — ABNORMAL LOW (ref 3.70–5.32)
RDW: 14.7 % (ref 11.1–15.7)
WBC: 6.6 10*3/uL (ref 3.9–10.0)

## 2016-05-08 LAB — COMPREHENSIVE METABOLIC PANEL (CC13)
A/G RATIO: 1.9 (ref 1.2–2.2)
ALT: 13 IU/L (ref 0–32)
AST (SGOT): 13 IU/L (ref 0–40)
Albumin, Serum: 4.3 g/dL (ref 3.5–5.5)
Alkaline Phosphatase, S: 92 IU/L (ref 39–117)
BUN/Creatinine Ratio: 10 (ref 9–23)
BUN: 7 mg/dL (ref 6–24)
Bilirubin Total: <0.2 mg/dL (ref 0.0–1.2)
CALCIUM: 9.1 mg/dL (ref 8.7–10.2)
CREATININE: 0.71 mg/dL (ref 0.57–1.00)
Carbon Dioxide, Total: 24 mmol/L (ref 18–29)
Chloride, Ser: 101 mmol/L (ref 96–106)
GFR, EST AFRICAN AMERICAN: 115 mL/min/{1.73_m2} (ref >59)
GFR, EST NON AFRICAN AMERICAN: 100 mL/min/{1.73_m2} (ref >59)
GLOBULIN, TOTAL: 2.3 g/dL (ref 1.5–4.5)
Glucose: 95 mg/dL (ref 65–99)
POTASSIUM: 3.7 mmol/L (ref 3.5–5.2)
Sodium: 130 mmol/L — ABNORMAL LOW (ref 134–144)
TOTAL PROTEIN: 6.6 g/dL (ref 6.0–8.5)

## 2016-05-08 MED ORDER — ALPRAZOLAM 2 MG PO TABS: 2.0000 mg | ORAL_TABLET | Freq: Every evening | ORAL | 0 refills | Status: AC | PRN

## 2016-05-08 MED ORDER — AMPHETAMINE-DEXTROAMPHETAMINE 30 MG PO TABS: 30.0000 mg | ORAL_TABLET | Freq: Every day | ORAL | 0 refills | Status: DC

## 2016-05-08 MED ORDER — SODIUM CHLORIDE 0.9% FLUSH
10.0000 mL | INTRAVENOUS | Status: DC | PRN
  Administered 2016-05-08: 10 mL via INTRAVENOUS
  Filled 2016-05-08: qty 10

## 2016-05-08 MED ORDER — HEPARIN SOD (PORK) LOCK FLUSH 100 UNIT/ML IV SOLN
500.0000 [IU] | Freq: Once | INTRAVENOUS | Status: AC
  Administered 2016-05-08: 500 [IU] via INTRAVENOUS
  Filled 2016-05-08: qty 5

## 2016-05-08 MED ORDER — ZOLPIDEM TARTRATE 10 MG PO TABS: 10.0000 mg | ORAL_TABLET | Freq: Every evening | ORAL | 0 refills | Status: DC | PRN

## 2016-05-08 MED FILL — AMPHETAMINE SALTS 30 MG TAB: 30 | 30 days supply | Qty: 30 | Fill #0

## 2016-05-08 MED FILL — ALPRAZolam 2 MG TABS: 2 | 30 days supply | Qty: 30 | Fill #0

## 2016-05-08 MED FILL — ZOLPIDEM TARTRATE 10 MG TAB: 10 | 30 days supply | Qty: 30 | Fill #0

## 2016-05-08 NOTE — Patient Instructions (Signed)
Implanted Port Insertion, Care After °This sheet gives you information about how to care for yourself after your procedure. Your health care provider may also give you more specific instructions. If you have problems or questions, contact your health care provider. °What can I expect after the procedure? °After your procedure, it is common to have: °· Discomfort at the port insertion site. °· Bruising on the skin over the port. This should improve over 3-4 days. ° °Follow these instructions at home: °Port care °· After your port is placed, you will get a manufacturer's information card. The card has information about your port. Keep this card with you at all times. °· Take care of the port as told by your health care provider. Ask your health care provider if you or a family member can get training for taking care of the port at home. A home health care nurse may also take care of the port. °· Make sure to remember what type of port you have. °Incision care °· Follow instructions from your health care provider about how to take care of your port insertion site. Make sure you: °? Wash your hands with soap and water before you change your bandage (dressing). If soap and water are not available, use hand sanitizer. °? Change your dressing as told by your health care provider. °? Leave stitches (sutures), skin glue, or adhesive strips in place. These skin closures may need to stay in place for 2 weeks or longer. If adhesive strip edges start to loosen and curl up, you may trim the loose edges. Do not remove adhesive strips completely unless your health care provider tells you to do that. °· Check your port insertion site every day for signs of infection. Check for: °? More redness, swelling, or pain. °? More fluid or blood. °? Warmth. °? Pus or a bad smell. °General instructions °· Do not take baths, swim, or use a hot tub until your health care provider approves. °· Do not lift anything that is heavier than 10 lb (4.5  kg) for a week, or as told by your health care provider. °· Ask your health care provider when it is okay to: °? Return to work or school. °? Resume usual physical activities or sports. °· Do not drive for 24 hours if you were given a medicine to help you relax (sedative). °· Take over-the-counter and prescription medicines only as told by your health care provider. °· Wear a medical alert bracelet in case of an emergency. This will tell any health care providers that you have a port. °· Keep all follow-up visits as told by your health care provider. This is important. °Contact a health care provider if: °· You cannot flush your port with saline as directed, or you cannot draw blood from the port. °· You have a fever or chills. °· You have more redness, swelling, or pain around your port insertion site. °· You have more fluid or blood coming from your port insertion site. °· Your port insertion site feels warm to the touch. °· You have pus or a bad smell coming from the port insertion site. °Get help right away if: °· You have chest pain or shortness of breath. °· You have bleeding from your port that you cannot control. °Summary °· Take care of the port as told by your health care provider. °· Change your dressing as told by your health care provider. °· Keep all follow-up visits as told by your health care provider. °  This information is not intended to replace advice given to you by your health care provider. Make sure you discuss any questions you have with your health care provider. °Document Released: 10/09/2012 Document Revised: 11/10/2015 Document Reviewed: 11/10/2015 °Elsevier Interactive Patient Education © 2017 [employer] ° °

## 2016-05-08 NOTE — Progress Notes (Signed)
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 New Patient (Initial Visit)  : My cancer might of come back.  HPI: [name] is a very nice 51 year old white female. She has a very complicated past medical history. She has a history of systemic lupus. She has a possible history of rheumatoid arthritis. She has chronic pain issues.  She, back in 2015, began to have more and more abdominal pain. This was lower abdominal pain. She was having some nausea but no vomiting. She has had no melena or bright red blood per rectum.  Ultimately, she has a severe severe pain that she had a go to the emergency room. She apparently had a CT scan done. She then underwent a procedure. It sounds like she underwent a right salpingo-oophorectomy. She had undergone a left salpingo-oophorectomy back in 2005. She had ovarian portion. She had an incidental cystotomy. The pathology showed a high-grade serous adenocarcinoma. She did not have a hysterectomy.  She underwent chemotherapy with 3 cycles of carboplatin and Taxol. She then had a PET scan area and this apparently showed metastatic disease. She had additional chemotherapy with carboplatinum/Taxol. It sounds like she had a last chemotherapy back in December 2016.  She has an indwelling catheter. She has a neurogenic bladder.  She has been to several doctors. She was previously at [facility]. Apparently she was discharged from their practice. This sounds like she was not compliant with follow-up.  From the record, she has quite a few doctors. I'm not sure exactly who she is seen now. She says she needs a family doctor. From the record, she has seen internal medicine, physical medicine, gynecologic oncology, urology.  I cannot cannot find a recent CA-125.   She has a lot of anxiety. She's not sleeping all that well. She has chronic pelvic pain. She has had no  fever. She has had no cough.  She does not smoke. She used to smoke. I think she drinks.  She never has been tested for BRCA gene. I think this is very important since she was diagnosed before the age of 35. There she has 1 son. She's had multiple miscarriages.  There's been no rashes.  I am not sure she's had a mammogram. I think she has not had one. She has is been quite a while.  Overall, it just seems like she is in limbo with trying to get a doctor to try to help her out and try to coordinate all of her health issues.  I would have to say that her performance status is actually pretty good. Her overall performance status is ECOG 1.    Past Medical History:  Diagnosis Date  . Arthritis   . Blood transfusion without reported diagnosis   . Cancer ([place])   . Endometriosis   . Hypertension   . Lupus   . MRSA (methicillin resistant Staphylococcus aureus) 12/2013  . Ovary, ovarian pedicle and fallopian tube torsion   . RA (rheumatoid arthritis) ([place])   . Seizure [facility])   :  Past Surgical History:  Procedure Laterality Date  . ABDOMINAL HYSTERECTOMY    . APPENDECTOMY    . HYSTEROSCOPY W/ ENDOMETRIAL ABLATION    . LYMPH NODE DISSECTION    . OOPHORECTOMY    :   Current Outpatient Prescriptions:  .  amphetamine-dextroamphetamine (ADDERALL) 30 MG tablet, Take 1 tablet by mouth daily., Disp: 30 tablet, Rfl: 0 .  clindamycin (CLINDAGEL) 1 % gel, Apply topically 2 (two) times daily., Disp: , Rfl:  .  albuterol (PROVENTIL HFA;VENTOLIN HFA) 108 (90 BASE) MCG/ACT inhaler, Inhale 2 puffs into the lungs every 6 (six) hours as needed for wheezing., Disp: , Rfl:  .  alprazolam (XANAX) 2 MG tablet, Take 1 tablet (2 mg total) by mouth at bedtime as needed for anxiety., Disp: 30 tablet, Rfl: 0 .  dicyclomine (BENTYL) 20 MG tablet, Take 20 mg by mouth 3 (three) times daily as needed., Disp: , Rfl:  .  fluticasone (FLONASE) 50 MCG/ACT nasal spray, Place 2 sprays into the nose daily., Disp: ,  Rfl:  .  HYDROcodone-acetaminophen (LORTAB) 7.5-500 MG per tablet, Take 1 tablet by mouth 3 (three) times daily as needed for pain., Disp: , Rfl:  .  hydrocortisone (ANUSOL-HC) 2.5 % rectal cream, Place 1 application rectally 2 (two) times daily., Disp: , Rfl:  .  hydrocortisone (ANUSOL-HC) 25 MG suppository, Place 25 mg rectally 2 (two) times daily., Disp: , Rfl:  .  Lidocaine-Hydrocortisone Ace 3-2.5 % KIT, Place rectally., Disp: , Rfl:  .  meclizine (ANTIVERT) 25 MG tablet, Take 25 mg by mouth 3 (three) times daily as needed., Disp: , Rfl:  .  OMEGA 3 1000 MG CAPS, Take 2 capsules by mouth daily., Disp: , Rfl:  .  omeprazole (PRILOSEC) 20 MG capsule, Take 20 mg by mouth daily., Disp: , Rfl:  .  Prenatal Vit-Fe Fumarate-FA (PRENAPLUS) 27-1 MG TABS, Take 1 tablet by mouth daily., Disp: , Rfl:  .  promethazine (PHENERGAN) 25 MG tablet, Take 25 mg by mouth every 8 (eight) hours as needed for nausea., Disp: , Rfl:  .  sertraline (ZOLOFT) 100 MG tablet, Take 100 mg by mouth 2 (two) times daily., Disp: , Rfl:  .  tretinoin (RETIN-A) 0.025 % cream, Apply topically at bedtime., Disp: , Rfl:  .  vitamin E 100 UNIT capsule, Take 100 Units by mouth daily., Disp: , Rfl:  .  zolpidem (AMBIEN) 10 MG tablet, Take 1 tablet (10 mg total) by mouth at bedtime as needed for sleep., Disp: 30 tablet, Rfl: 0 No current facility-administered medications for this visit.   Facility-Administered Medications Ordered in Other Visits:  .  sodium chloride flush (NS) 0.9 % injection 10 mL, 10 mL, Intravenous, PRN, Volanda Napoleon, MD, 10 mL at 05/08/16 1602:  :  Allergies  Allergen Reactions  . Plaquenil [Hydroxychloroquine Sulfate]     Seizure   :  No family history on file.:  Social History   Social History  . Marital status: Married    Spouse name: N/A  . Number of children: N/A  . Years of education: N/A   Occupational History  . Not on file.   Social History Main Topics  . Smoking status: Current  Every Day Smoker  . Smokeless tobacco: Never Used  . Alcohol use No  . Drug use: No  . Sexual activity: Not on file   Other Topics Concern  . Not on file   Social History Narrative  . No narrative on file  :  Pertinent items are noted in HPI.  Exam:Well-developed and well-nourished white female in no obvious distress. Vital signs showed temperature of 98.1. Pulse 117. Blood pressure 97/64. Weight is 168 pounds. Head neck exam shows no ocular or oral lesions. There are no palpable cervical or supraclavicular lymph nodes. There is no scleral icterus. Her lungs are clear bilaterally. Cardiac exam is tachycardia but regular. She has  no murmurs, rubs or bruits. Abdomen is soft. She has the laparotomy scar that is well-healed in the suprapubic region. There is no fluid wave. There is no abdominal mass. There is some slight guarding. She has no subcutaneous nodules. She has no palpable liver or spleen tip. Extremities shows no clubbing, cyanosis or edema. Skin exam shows no rashes, ecchymoses or petechia.   Recent Labs  05/08/16 1517  WBC 6.6  HGB 10.9*  HCT 33.8*  PLT 281   No results for input(s): NA, K, CL, CO2, GLUCOSE, BUN, CREATININE, CALCIUM in the last 72 hours.  Blood smear review:  None  Pathology: None     Assessment and Plan:  Ms. Opfer is a 51 year old white female. It sounds like she had early stage fallopian tube cancer. Again, I cannot find a path report. I cannot find the operative report.  I am somewhat dubious about her having recurrent disease. Again the CA-125 will be incredibly helpful.  If she does have evidence of disease, we may have to consider a biopsy. I would think however that she ultimately would need some form of surgical intervention.  She really needs a family doctor. I will have to see about making a referral for family medicine. Maybe she will be seen downstairs.  She has her rheumatologist. She really has to follow-up with him.  I thought she  had a pain specialist. I don't know if she is still seeing the pain specialist. I will definitely recommend this when I see her back.  I went ahead and refilled her Adderall. She certainly see why she needed it. She was quite "haphazard" with her attention span.  I spent over an hour with she and her husband. We will do our best to try to help her out. However, I just don't have a feeling that cancer is going to be her problem. I hope it will not be.

## 2016-05-09 ENCOUNTER — Telehealth: Payer: Self-pay | Admitting: *Deleted

## 2016-05-09 LAB — CA 125: Cancer Antigen (CA) 125: 21.5 U/mL (ref 0.0–38.1)

## 2016-05-09 NOTE — Telephone Encounter (Addendum)
Patient is aware of results  ----- Message from Volanda Napoleon, MD sent at 05/09/2016  6:48 AM EDT ----- Call - the tumor marker - CA-125 - is normal!!!  This is a very good indicator that you probably do not have cancer!!  [name]

## 2016-05-22 ENCOUNTER — Encounter (HOSPITAL_COMMUNITY): Payer: Medicaid Other

## 2016-06-01 ENCOUNTER — Other Ambulatory Visit: Payer: Self-pay | Admitting: Family

## 2016-06-01 DIAGNOSIS — C569 Malignant neoplasm of unspecified ovary: Secondary | ICD-10-CM

## 2016-06-02 ENCOUNTER — Ambulatory Visit (HOSPITAL_COMMUNITY): Admission: RE | Admit: 2016-06-02 | Payer: Medicaid Other | Source: Ambulatory Visit

## 2016-06-06 ENCOUNTER — Ambulatory Visit (HOSPITAL_COMMUNITY): Payer: Medicaid Other

## 2016-06-07 ENCOUNTER — Ambulatory Visit (HOSPITAL_BASED_OUTPATIENT_CLINIC_OR_DEPARTMENT_OTHER): Payer: Medicaid Other | Admitting: Hematology & Oncology

## 2016-06-07 ENCOUNTER — Ambulatory Visit (HOSPITAL_BASED_OUTPATIENT_CLINIC_OR_DEPARTMENT_OTHER): Payer: Medicaid Other

## 2016-06-07 ENCOUNTER — Ambulatory Visit (HOSPITAL_COMMUNITY): Admission: RE | Admit: 2016-06-07 | Payer: Medicaid Other | Source: Ambulatory Visit

## 2016-06-07 ENCOUNTER — Other Ambulatory Visit (HOSPITAL_BASED_OUTPATIENT_CLINIC_OR_DEPARTMENT_OTHER): Payer: Medicaid Other

## 2016-06-07 VITALS — BP 135/99 | HR 119 | Temp 98.4°F | Resp 20 | Wt 169.0 lb

## 2016-06-07 DIAGNOSIS — C569 Malignant neoplasm of unspecified ovary: Secondary | ICD-10-CM

## 2016-06-07 DIAGNOSIS — M3210 Systemic lupus erythematosus, organ or system involvement unspecified: Secondary | ICD-10-CM | POA: Diagnosis not present

## 2016-06-07 DIAGNOSIS — Z8543 Personal history of malignant neoplasm of ovary: Secondary | ICD-10-CM

## 2016-06-07 DIAGNOSIS — M3219 Other organ or system involvement in systemic lupus erythematosus: Secondary | ICD-10-CM

## 2016-06-07 DIAGNOSIS — F4011 Social phobia, generalized: Secondary | ICD-10-CM

## 2016-06-07 DIAGNOSIS — M320 Drug-induced systemic lupus erythematosus: Secondary | ICD-10-CM

## 2016-06-07 DIAGNOSIS — Z95828 Presence of other vascular implants and grafts: Secondary | ICD-10-CM

## 2016-06-07 DIAGNOSIS — I10 Essential (primary) hypertension: Secondary | ICD-10-CM | POA: Diagnosis not present

## 2016-06-07 DIAGNOSIS — Z8589 Personal history of malignant neoplasm of other organs and systems: Secondary | ICD-10-CM

## 2016-06-07 DIAGNOSIS — G8928 Other chronic postprocedural pain: Secondary | ICD-10-CM

## 2016-06-07 LAB — CMP (CANCER CENTER ONLY)
ALT(SGPT): 24 U/L (ref 10–47)
AST: 24 U/L (ref 11–38)
Albumin: 3.4 g/dL (ref 3.3–5.5)
Alkaline Phosphatase: 70 U/L (ref 26–84)
BILIRUBIN TOTAL: 0.5 mg/dL (ref 0.20–1.60)
BUN: 9 mg/dL (ref 7–22)
CHLORIDE: 104 meq/L (ref 98–108)
CO2: 23 meq/L (ref 18–33)
CREATININE: 0.9 mg/dL (ref 0.6–1.2)
Calcium: 9.1 mg/dL (ref 8.0–10.3)
Glucose, Bld: 107 mg/dL (ref 73–118)
Potassium: 3.1 mEq/L — ABNORMAL LOW (ref 3.3–4.7)
SODIUM: 137 meq/L (ref 128–145)
TOTAL PROTEIN: 6.7 g/dL (ref 6.4–8.1)

## 2016-06-07 LAB — CBC WITH DIFFERENTIAL (CANCER CENTER ONLY)
BASO#: 0.1 10*3/uL (ref 0.0–0.2)
BASO%: 1 % (ref 0.0–2.0)
EOS ABS: 0 10*3/uL (ref 0.0–0.5)
EOS%: 0.8 % (ref 0.0–7.0)
HCT: 37 % (ref 34.8–46.6)
HGB: 12.3 g/dL (ref 11.6–15.9)
LYMPH#: 2.2 10*3/uL (ref 0.9–3.3)
LYMPH%: 43.5 % (ref 14.0–48.0)
MCH: 30.6 pg (ref 26.0–34.0)
MCHC: 33.2 g/dL (ref 32.0–36.0)
MCV: 92 fL (ref 81–101)
MONO#: 0.5 10*3/uL (ref 0.1–0.9)
MONO%: 9.8 % (ref 0.0–13.0)
NEUT%: 44.9 % (ref 39.6–80.0)
NEUTROS ABS: 2.2 10*3/uL (ref 1.5–6.5)
PLATELETS: 188 10*3/uL (ref 145–400)
RBC: 4.02 10*6/uL (ref 3.70–5.32)
RDW: 14.7 % (ref 11.1–15.7)
WBC: 5 10*3/uL (ref 3.9–10.0)

## 2016-06-07 MED ORDER — AMPHETAMINE-DEXTROAMPHETAMINE 30 MG PO TABS: 30.0000 mg | ORAL_TABLET | Freq: Every day | ORAL | 0 refills | Status: DC

## 2016-06-07 MED ORDER — ZOLPIDEM TARTRATE 10 MG PO TABS: 10.0000 mg | ORAL_TABLET | Freq: Every evening | ORAL | 0 refills | Status: DC | PRN

## 2016-06-07 MED ORDER — SODIUM CHLORIDE 0.9% FLUSH
10.0000 mL | INTRAVENOUS | Status: DC | PRN
  Administered 2016-06-07: 10 mL via INTRAVENOUS
  Filled 2016-06-07: qty 10

## 2016-06-07 MED ORDER — HEPARIN SOD (PORK) LOCK FLUSH 100 UNIT/ML IV SOLN
500.0000 [IU] | Freq: Once | INTRAVENOUS | Status: AC
  Administered 2016-06-07: 500 [IU] via INTRAVENOUS
  Filled 2016-06-07: qty 5

## 2016-06-07 MED ORDER — AMOXICILLIN-POT CLAVULANATE 875-125 MG PO TABS: 1.0000 | ORAL_TABLET | Freq: Two times a day (BID) | ORAL | 0 refills | Status: DC

## 2016-06-07 MED FILL — AMOX-CLAV 875-125 MG TABLET: 875-125 | 10 days supply | Qty: 20 | Fill #0

## 2016-06-07 MED FILL — ZOLPIDEM TARTRATE 10 MG TAB: 10 | 30 days supply | Qty: 30 | Fill #0

## 2016-06-07 MED FILL — AMPHETAMINE SALTS 30 MG TAB: 30 | 30 days supply | Qty: 30 | Fill #0

## 2016-06-08 LAB — ANTINUCLEAR ANTIBODIES, IFA: ANA Titer 1: NEGATIVE

## 2016-06-08 LAB — CA 125: Cancer Antigen (CA) 125: 30.9 U/mL (ref 0.0–38.1)

## 2016-06-08 LAB — RHEUMATOID FACTOR: RA Latex Turbid.: <10 IU/mL (ref 0.0–13.9)

## 2016-06-09 NOTE — Progress Notes (Signed)
Hematology and Oncology Follow Up Visit  [name] [id number] November 04, 1965 51 y.o. 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 She has had a lot of difficulties. Unfortunately, I think a lot of her issues are not oncological.  She is supposed to have a PET scan done. She did not make it to the PET scan.  Her last CA-125 was normal. It was 21.5.  She has a lot of anxiety. She is not sleeping well. She has a lot of pain issues. She just has a lot of non-oncologic issues that I'm not sure she will have taking care of. She has skin lesions. I'm not sure why this is going on. She says she has seen multiple dermatologists who "do not know what to do." I will go ahead and put her on some Augmentin.  She's not had any problem with bowels or bladder. She's had no bleeding. She's had no fever. Has been no leg swelling.  Overall, her performance status is ECOG 1.  Medications:  Current Outpatient Prescriptions:  .  lidocaine (LIDODERM) 5 %, Apply patch to painful area. Patch may remain in place for up to 12 hours in a 24 hour period., Disp: , Rfl:  .  ondansetron (ZOFRAN) 24 MG tablet, Take 24 mg by mouth once., Disp: , Rfl:  .  oxybutynin (DITROPAN) 5 MG tablet, Take 5 mg by mouth., Disp: , Rfl:  .  phenazopyridine (PYRIDIUM) 200 MG tablet, Take 200 mg by mouth., Disp: , Rfl:  .  albuterol (PROVENTIL HFA;VENTOLIN HFA) 108 (90 BASE) MCG/ACT inhaler, Inhale 2 puffs into the lungs every 6 (six) hours as needed for wheezing., Disp: , Rfl:  .  alprazolam (XANAX) 2 MG tablet, Take 1 tablet (2 mg total) by mouth at bedtime as needed for anxiety., Disp: 30 tablet, Rfl: 0 .  amoxicillin-clavulanate (AUGMENTIN) 875-125 MG tablet, Take 1 tablet by mouth 2 (two) times daily., Disp: 20 tablet, Rfl: 0 .  amphetamine-dextroamphetamine  (ADDERALL) 30 MG tablet, Take 1 tablet by mouth daily., Disp: 30 tablet, Rfl: 0 .  clindamycin (CLINDAGEL) 1 % gel, Apply topically 2 (two) times daily., Disp: , Rfl:  .  dicyclomine (BENTYL) 20 MG tablet, Take 20 mg by mouth 3 (three) times daily as needed., Disp: , Rfl:  .  fluticasone (FLONASE) 50 MCG/ACT nasal spray, Place 2 sprays into the nose daily., Disp: , Rfl:  .  HYDROcodone-acetaminophen (LORTAB) 7.5-500 MG per tablet, Take 1 tablet by mouth 3 (three) times daily as needed for pain., Disp: , Rfl:  .  Lidocaine-Hydrocortisone Ace 3-2.5 % KIT, Place rectally., Disp: , Rfl:  .  meclizine (ANTIVERT) 25 MG tablet, Take 25 mg by mouth 3 (three) times daily as needed., Disp: , Rfl:  .  OMEGA 3 1000 MG CAPS, Take 2 capsules by mouth daily., Disp: , Rfl:  .  omeprazole (PRILOSEC) 20 MG capsule, Take 20 mg by mouth daily., Disp: , Rfl:  .  Prenatal Vit-Fe Fumarate-FA (PRENAPLUS) 27-1 MG TABS, Take 1 tablet by mouth daily., Disp: , Rfl:  .  sertraline (ZOLOFT) 100 MG tablet, Take 100 mg by mouth 2 (two) times daily., Disp: , Rfl:  .  tretinoin (RETIN-A) 0.025 % cream, Apply topically at bedtime., Disp: , Rfl:  .  vitamin E 100 UNIT capsule, Take 100 Units by  mouth daily., Disp: , Rfl:  .  zolpidem (AMBIEN) 10 MG tablet, Take 1 tablet (10 mg total) by mouth at bedtime as needed for sleep., Disp: 30 tablet, Rfl: 0  Allergies:  Allergies  Allergen Reactions  . Plaquenil [Hydroxychloroquine Sulfate]     Seizure     Past Medical History, Surgical history, Social history, and Family History were reviewed and updated.  Review of Systems: Head neck exam shows no ocular or oral lesions. There are no palpable cervical or supraclavicular lymph nodes. There is no scleral icterus. Her lungs are clear bilaterally. Cardiac exam is tachycardia but regular. She has no murmurs, rubs or bruits. Abdomen is soft. She has the laparotomy scar that is well-healed in the suprapubic region. There is no fluid wave.  There is no abdominal mass. There is some slight guarding. She has no subcutaneous nodules. She has no palpable liver or spleen tip. Extremities shows no clubbing, cyanosis or edema. Skin exam shows no rashes, ecchymoses or petechia.  Physical Exam:  weight is 169 lb (76.7 kg). Her oral temperature is 98.4 F (36.9 C). Her blood pressure is 135/99 (abnormal) and her pulse is 119 (abnormal). Her respiration is 20 and oxygen saturation is 97%.   Wt Readings from Last 3 Encounters:  06/07/16 169 lb (76.7 kg)  05/08/16 168 lb 1.9 oz (76.3 kg)  05/05/16 180 lb (81.6 kg)     See Above  Lab Results  Component Value Date   WBC 5.0 06/07/2016   HGB 12.3 06/07/2016   HCT 37.0 06/07/2016   MCV 92 06/07/2016   PLT 188 06/07/2016     Chemistry      Component Value Date/Time   NA 137 06/07/2016 1038   K 3.1 (L) 06/07/2016 1038   CL 104 06/07/2016 1038   CO2 23 06/07/2016 1038   BUN 9 06/07/2016 1038   CREATININE 0.9 06/07/2016 1038      Component Value Date/Time   CALCIUM 9.1 06/07/2016 1038   ALKPHOS 70 06/07/2016 1038   AST 24 06/07/2016 1038   ALT 24 06/07/2016 1038   BILITOT 0.50 06/07/2016 1038         Impression and Plan: [name] is a 51 year old white female. She has a history of a right fallopian to malignancy. This was treated with surgery. This was 13 years ago in 2005. She had Taxol/carboplatinum. Apparently, there was then "metastatic disease.". She had extra chemotherapy with Taxol/carboplatinum.  With [name], is very difficult to know exactly what is going on. This PET scan which she did not make would be incredibly helpful.  Hopefully, she will have this next week.  I told her that there really is not much that I can do for her right now. I cannot find any obvious oncologic issue. She has all these psychological problems. She definitely needs some psychological intervention. Hopefully, she will be able to see behavioral health.  She also needs a family  doctor. The she's been to several family doctors and, not surprisingly, there have been issues that she has not returned.  I will see her back in another 4-6 weeks.    Volanda Napoleon, MD 6/8/20183:51 PM

## 2016-06-13 ENCOUNTER — Ambulatory Visit (HOSPITAL_COMMUNITY): Payer: Medicaid Other

## 2016-06-13 ENCOUNTER — Other Ambulatory Visit: Payer: Self-pay | Admitting: *Deleted

## 2016-06-13 DIAGNOSIS — F411 Generalized anxiety disorder: Secondary | ICD-10-CM

## 2016-06-13 DIAGNOSIS — F32A Depression, unspecified: Secondary | ICD-10-CM

## 2016-06-13 DIAGNOSIS — F329 Major depressive disorder, single episode, unspecified: Secondary | ICD-10-CM

## 2016-06-15 ENCOUNTER — Ambulatory Visit (HOSPITAL_COMMUNITY): Payer: Medicaid Other

## 2016-06-22 ENCOUNTER — Ambulatory Visit (HOSPITAL_COMMUNITY): Payer: Medicaid Other

## 2016-06-28 ENCOUNTER — Other Ambulatory Visit: Payer: Self-pay | Admitting: *Deleted

## 2016-06-28 ENCOUNTER — Ambulatory Visit (HOSPITAL_COMMUNITY): Payer: Medicaid Other

## 2016-06-28 DIAGNOSIS — M3210 Systemic lupus erythematosus, organ or system involvement unspecified: Secondary | ICD-10-CM

## 2016-06-28 DIAGNOSIS — M3219 Other organ or system involvement in systemic lupus erythematosus: Secondary | ICD-10-CM

## 2016-06-28 DIAGNOSIS — I10 Essential (primary) hypertension: Secondary | ICD-10-CM

## 2016-06-28 DIAGNOSIS — G8928 Other chronic postprocedural pain: Secondary | ICD-10-CM

## 2016-06-28 DIAGNOSIS — C569 Malignant neoplasm of unspecified ovary: Secondary | ICD-10-CM

## 2016-06-28 DIAGNOSIS — M320 Drug-induced systemic lupus erythematosus: Secondary | ICD-10-CM

## 2016-06-28 DIAGNOSIS — F4011 Social phobia, generalized: Secondary | ICD-10-CM

## 2016-06-28 DIAGNOSIS — F411 Generalized anxiety disorder: Secondary | ICD-10-CM

## 2016-06-28 MED ORDER — AMOXICILLIN-POT CLAVULANATE 875-125 MG PO TABS: 1.0000 | ORAL_TABLET | Freq: Two times a day (BID) | ORAL | 0 refills | Status: DC

## 2016-07-03 ENCOUNTER — Other Ambulatory Visit: Payer: Self-pay | Admitting: *Deleted

## 2016-07-03 DIAGNOSIS — M3219 Other organ or system involvement in systemic lupus erythematosus: Secondary | ICD-10-CM

## 2016-07-03 DIAGNOSIS — F4011 Social phobia, generalized: Secondary | ICD-10-CM

## 2016-07-03 DIAGNOSIS — M3210 Systemic lupus erythematosus, organ or system involvement unspecified: Secondary | ICD-10-CM

## 2016-07-03 DIAGNOSIS — C569 Malignant neoplasm of unspecified ovary: Secondary | ICD-10-CM

## 2016-07-03 DIAGNOSIS — M320 Drug-induced systemic lupus erythematosus: Secondary | ICD-10-CM

## 2016-07-03 DIAGNOSIS — I10 Essential (primary) hypertension: Secondary | ICD-10-CM

## 2016-07-03 DIAGNOSIS — G8928 Other chronic postprocedural pain: Secondary | ICD-10-CM

## 2016-07-03 MED ORDER — AMPHETAMINE-DEXTROAMPHETAMINE 30 MG PO TABS: 30.0000 mg | ORAL_TABLET | Freq: Every day | ORAL | 0 refills | Status: DC

## 2016-07-06 ENCOUNTER — Other Ambulatory Visit: Payer: Self-pay | Admitting: *Deleted

## 2016-07-06 DIAGNOSIS — F4011 Social phobia, generalized: Secondary | ICD-10-CM

## 2016-07-06 DIAGNOSIS — G8928 Other chronic postprocedural pain: Secondary | ICD-10-CM

## 2016-07-06 DIAGNOSIS — M320 Drug-induced systemic lupus erythematosus: Secondary | ICD-10-CM

## 2016-07-06 DIAGNOSIS — I10 Essential (primary) hypertension: Secondary | ICD-10-CM

## 2016-07-06 DIAGNOSIS — C569 Malignant neoplasm of unspecified ovary: Secondary | ICD-10-CM

## 2016-07-06 DIAGNOSIS — M3219 Other organ or system involvement in systemic lupus erythematosus: Secondary | ICD-10-CM

## 2016-07-06 DIAGNOSIS — M3210 Systemic lupus erythematosus, organ or system involvement unspecified: Secondary | ICD-10-CM

## 2016-07-06 MED ORDER — ZOLPIDEM TARTRATE 10 MG PO TABS: 10.0000 mg | ORAL_TABLET | Freq: Every evening | ORAL | 0 refills | Status: AC | PRN

## 2016-07-07 ENCOUNTER — Telehealth: Payer: Self-pay | Admitting: *Deleted

## 2016-07-07 NOTE — Telephone Encounter (Signed)
Received notification from [place] that patient has cancelled her PET scan eight times. Her insurance authorization has now expired. Dr Marin Olp has repeatedly spoken to patient about the importance of this scan.  Patient has also been asked to establish care with a primary care physician as she has many prescriptions that she needs filled. On 06/07/16 during her appointment several prescriptions were given to cover the coverage gap. As of 07/06/2016 when the husband was contacted, the patient has still not seen a primary care physician.  Dr Marin Olp notified of the above and due to patient non-compliance with the treatment protocol, this office will not provide any further prescriptions until the patient obtains their PET scan and then can be re-seen by Dr Marin Olp.

## 2016-07-10 ENCOUNTER — Ambulatory Visit (HOSPITAL_COMMUNITY): Payer: Medicaid Other

## 2016-07-14 ENCOUNTER — Other Ambulatory Visit: Payer: Medicaid Other

## 2016-07-14 ENCOUNTER — Ambulatory Visit: Payer: Medicaid Other | Admitting: Family

## 2016-07-20 ENCOUNTER — Telehealth: Payer: Self-pay | Admitting: *Deleted

## 2016-07-20 NOTE — Telephone Encounter (Signed)
Patient's husband calling the office stating that the patient's face is still red and covered in a rash. Per the patient they have been to the ED several times and to three different dermatologists. He wants to know what to do next.  Explained to the husband that we do not treat conditions unrelated to her cancer diagnosis. The name of a dermatologist that this office commonly uses was given to him as a possible provider to assess the facial rash. Name and office number of Dr Allyson Sabal given to husband.

## 2016-08-07 ENCOUNTER — Ambulatory Visit (HOSPITAL_COMMUNITY): Payer: Medicaid Other

## 2016-08-14 ENCOUNTER — Telehealth: Payer: Self-pay | Admitting: *Deleted

## 2016-08-14 NOTE — Telephone Encounter (Signed)
Received call from patient husband asking the status of the PET scan for wife.  States patient is in increased pain , more lethargic and abdomen is red and warm to touch and painful.  Spoke with Dr. Marin Olp about this and he states patient needs to go to the ED.  Husband understands this information and will take her to the ED

## 2016-08-28 ENCOUNTER — Telehealth: Payer: Self-pay | Admitting: *Deleted

## 2016-08-28 NOTE — Telephone Encounter (Signed)
Patient's husband calling the office to follow up on PET scan approval.   Pet scan was ordered and approved in June. It was scheduled on 8 different times and patient cancelled or didn't show to appointment. The expiration expired and had to be done again. This office received word that the scan was approved, however when the PET was scheduled for the 9th time, radiology cancelled it stating there was no approval.  Baxter Flattery is now working on getting this scan approved once again.  The husband has many questions about what to do with her multitude of symptoms. Explained to husband that without the PET scan Dr Marin Olp doesn't have the information needed to know if patient has a recurrence or benign process. Once again reminded that they must follow through with scan once it's approved and scheduled.  Husband understood but also stated that he only missed 3 appointments. Reviewed in the chart with him all 8 missed appointments.   He will call back Wednesday or Thursday to check on approval for PET. In the mean time the patient will follow up with dermatology as scheduled and see her PCP or go to the ED as needed.

## 2016-09-07 ENCOUNTER — Other Ambulatory Visit: Payer: Self-pay | Admitting: Hematology & Oncology

## 2016-09-07 DIAGNOSIS — C569 Malignant neoplasm of unspecified ovary: Secondary | ICD-10-CM

## 2016-09-21 ENCOUNTER — Ambulatory Visit (HOSPITAL_BASED_OUTPATIENT_CLINIC_OR_DEPARTMENT_OTHER): Payer: Medicaid Other

## 2016-09-21 ENCOUNTER — Ambulatory Visit (HOSPITAL_BASED_OUTPATIENT_CLINIC_OR_DEPARTMENT_OTHER)
Admission: RE | Admit: 2016-09-21 | Discharge: 2016-09-21 | Disposition: A | Discharge status: Home / Self Care | Payer: Medicaid Other | Source: Ambulatory Visit | Attending: Hematology & Oncology | Admitting: Hematology & Oncology

## 2016-09-21 ENCOUNTER — Encounter (HOSPITAL_BASED_OUTPATIENT_CLINIC_OR_DEPARTMENT_OTHER): Payer: Self-pay

## 2016-09-21 DIAGNOSIS — C569 Malignant neoplasm of unspecified ovary: Secondary | ICD-10-CM

## 2016-09-21 DIAGNOSIS — Z8543 Personal history of malignant neoplasm of ovary: Secondary | ICD-10-CM | POA: Diagnosis not present

## 2016-09-21 DIAGNOSIS — Z452 Encounter for adjustment and management of vascular access device: Secondary | ICD-10-CM | POA: Diagnosis not present

## 2016-09-21 DIAGNOSIS — I700 Atherosclerosis of aorta: Secondary | ICD-10-CM | POA: Diagnosis not present

## 2016-09-21 DIAGNOSIS — R911 Solitary pulmonary nodule: Secondary | ICD-10-CM | POA: Insufficient documentation

## 2016-09-21 MED ORDER — SODIUM CHLORIDE 0.9% FLUSH
10.0000 mL | INTRAVENOUS | Status: DC | PRN
  Administered 2016-09-21: 10 mL via INTRAVENOUS
  Filled 2016-09-21: qty 10

## 2016-09-21 MED ORDER — IOPAMIDOL (ISOVUE-300) INJECTION 61%
100.0000 mL | Freq: Once | INTRAVENOUS | Status: AC | PRN
  Administered 2016-09-21: 100 mL via INTRAVENOUS

## 2016-09-21 MED ORDER — HEPARIN SOD (PORK) LOCK FLUSH 100 UNIT/ML IV SOLN
500.0000 [IU] | Freq: Once | INTRAVENOUS | Status: AC
  Administered 2016-09-21: 500 [IU] via INTRAVENOUS
  Filled 2016-09-21: qty 5

## 2016-09-21 NOTE — Patient Instructions (Signed)
Implanted Port Insertion, Care After °This sheet gives you information about how to care for yourself after your procedure. Your health care provider may also give you more specific instructions. If you have problems or questions, contact your health care provider. °What can I expect after the procedure? °After your procedure, it is common to have: °· Discomfort at the port insertion site. °· Bruising on the skin over the port. This should improve over 3-4 days. ° °Follow these instructions at home: °Port care °· After your port is placed, you will get a manufacturer's information card. The card has information about your port. Keep this card with you at all times. °· Take care of the port as told by your health care provider. Ask your health care provider if you or a family member can get training for taking care of the port at home. A home health care nurse may also take care of the port. °· Make sure to remember what type of port you have. °Incision care °· Follow instructions from your health care provider about how to take care of your port insertion site. Make sure you: °? Wash your hands with soap and water before you change your bandage (dressing). If soap and water are not available, use hand sanitizer. °? Change your dressing as told by your health care provider. °? Leave stitches (sutures), skin glue, or adhesive strips in place. These skin closures may need to stay in place for 2 weeks or longer. If adhesive strip edges start to loosen and curl up, you may trim the loose edges. Do not remove adhesive strips completely unless your health care provider tells you to do that. °· Check your port insertion site every day for signs of infection. Check for: °? More redness, swelling, or pain. °? More fluid or blood. °? Warmth. °? Pus or a bad smell. °General instructions °· Do not take baths, swim, or use a hot tub until your health care provider approves. °· Do not lift anything that is heavier than 10 lb (4.5  kg) for a week, or as told by your health care provider. °· Ask your health care provider when it is okay to: °? Return to work or school. °? Resume usual physical activities or sports. °· Do not drive for 24 hours if you were given a medicine to help you relax (sedative). °· Take over-the-counter and prescription medicines only as told by your health care provider. °· Wear a medical alert bracelet in case of an emergency. This will tell any health care providers that you have a port. °· Keep all follow-up visits as told by your health care provider. This is important. °Contact a health care provider if: °· You cannot flush your port with saline as directed, or you cannot draw blood from the port. °· You have a fever or chills. °· You have more redness, swelling, or pain around your port insertion site. °· You have more fluid or blood coming from your port insertion site. °· Your port insertion site feels warm to the touch. °· You have pus or a bad smell coming from the port insertion site. °Get help right away if: °· You have chest pain or shortness of breath. °· You have bleeding from your port that you cannot control. °Summary °· Take care of the port as told by your health care provider. °· Change your dressing as told by your health care provider. °· Keep all follow-up visits as told by your health care provider. °  This information is not intended to replace advice given to you by your health care provider. Make sure you discuss any questions you have with your health care provider. °Document Released: 10/09/2012 Document Revised: 11/10/2015 Document Reviewed: 11/10/2015 °Elsevier Interactive Patient Education © 2017 [employer] ° °

## 2016-09-25 ENCOUNTER — Telehealth: Payer: Self-pay | Admitting: *Deleted

## 2016-09-25 NOTE — Telephone Encounter (Addendum)
Patients husband is aware of results   ----- Message from Volanda Napoleon, MD sent at 09/22/2016 12:10 PM EDT ----- Call - NO evidence of cancer!!!  No infection.  [name]

## 2016-10-24 ENCOUNTER — Other Ambulatory Visit: Payer: Medicaid Other

## 2016-10-24 ENCOUNTER — Ambulatory Visit: Payer: Medicaid Other | Admitting: Family

## 2016-10-31 ENCOUNTER — Other Ambulatory Visit: Payer: Medicaid Other

## 2016-10-31 ENCOUNTER — Ambulatory Visit: Payer: Medicaid Other | Admitting: Family

## 2017-01-18 ENCOUNTER — Inpatient Hospital Stay: Payer: Medicaid Other

## 2017-01-19 ENCOUNTER — Other Ambulatory Visit: Payer: Self-pay

## 2017-01-19 ENCOUNTER — Inpatient Hospital Stay: Payer: Medicaid Other | Attending: Hematology & Oncology

## 2017-01-19 ENCOUNTER — Other Ambulatory Visit: Payer: Self-pay | Admitting: *Deleted

## 2017-01-19 ENCOUNTER — Inpatient Hospital Stay: Payer: Medicaid Other

## 2017-01-19 DIAGNOSIS — Z95828 Presence of other vascular implants and grafts: Secondary | ICD-10-CM

## 2017-01-19 DIAGNOSIS — Z8543 Personal history of malignant neoplasm of ovary: Secondary | ICD-10-CM | POA: Insufficient documentation

## 2017-01-19 DIAGNOSIS — C569 Malignant neoplasm of unspecified ovary: Secondary | ICD-10-CM

## 2017-01-19 DIAGNOSIS — Z452 Encounter for adjustment and management of vascular access device: Secondary | ICD-10-CM | POA: Diagnosis not present

## 2017-01-19 LAB — CBC WITH DIFFERENTIAL (CANCER CENTER ONLY)
Basophils Absolute: 0.1 10*3/uL (ref 0.0–0.1)
Basophils Relative: 1 %
EOS ABS: 0.3 10*3/uL (ref 0.0–0.5)
EOS PCT: 5 %
HCT: 32.5 % — ABNORMAL LOW (ref 34.8–46.6)
Hemoglobin: 10.6 g/dL — ABNORMAL LOW (ref 11.6–15.9)
LYMPHS ABS: 2.2 10*3/uL (ref 0.9–3.3)
Lymphocytes Relative: 34 %
MCH: 28.8 pg (ref 26.0–34.0)
MCHC: 32.6 g/dL (ref 32.0–36.0)
MCV: 88.3 fL (ref 81.0–101.0)
MONO ABS: 0.6 10*3/uL (ref 0.1–0.9)
MONOS PCT: 9 %
Neutro Abs: 3.3 10*3/uL (ref 1.5–6.5)
Neutrophils Relative %: 51 %
PLATELETS: 248 10*3/uL (ref 145–400)
RBC: 3.68 MIL/uL — ABNORMAL LOW (ref 3.70–5.32)
RDW: 13.2 % (ref 11.1–15.7)
WBC Count: 6.4 10*3/uL (ref 3.9–10.3)

## 2017-01-19 LAB — CMP (CANCER CENTER ONLY)
ALK PHOS: 64 U/L (ref 26–84)
ALT: 18 U/L (ref 0–55)
AST: 29 U/L (ref 5–34)
Albumin: 3.7 g/dL (ref 3.5–5.0)
Anion gap: 9 (ref 5–15)
BUN: 6 mg/dL — AB (ref 7–22)
CHLORIDE: 109 mmol/L — AB (ref 98–108)
CO2: 24 mmol/L (ref 18–33)
Calcium: 9.4 mg/dL (ref 8.0–10.3)
Creatinine: 0.6 mg/dL (ref 0.60–1.10)
GLUCOSE: 125 mg/dL — AB (ref 73–118)
POTASSIUM: 3.6 mmol/L (ref 3.5–5.1)
SODIUM: 142 mmol/L (ref 128–145)
Total Bilirubin: 0.5 mg/dL (ref 0.2–1.2)
Total Protein: 7 g/dL (ref 6.4–8.1)

## 2017-01-19 MED ORDER — SODIUM CHLORIDE 0.9% FLUSH
10.0000 mL | INTRAVENOUS | Status: DC | PRN
  Administered 2017-01-19: 10 mL via INTRAVENOUS
  Filled 2017-01-19: qty 10

## 2017-01-19 MED ORDER — HEPARIN SOD (PORK) LOCK FLUSH 100 UNIT/ML IV SOLN
500.0000 [IU] | Freq: Once | INTRAVENOUS | Status: AC
  Administered 2017-01-19: 500 [IU] via INTRAVENOUS
  Filled 2017-01-19: qty 5

## 2017-01-20 LAB — CA 125: Cancer Antigen (CA) 125: 81.2 U/mL — ABNORMAL HIGH (ref 0.0–38.1)

## 2017-02-01 ENCOUNTER — Ambulatory Visit: Payer: Medicaid Other | Admitting: Hematology & Oncology

## 2017-02-01 ENCOUNTER — Inpatient Hospital Stay: Payer: Medicaid Other

## 2017-02-15 ENCOUNTER — Inpatient Hospital Stay: Payer: Medicaid Other | Attending: Hematology & Oncology

## 2017-02-15 ENCOUNTER — Inpatient Hospital Stay: Payer: Medicaid Other | Admitting: Hematology & Oncology

## 2017-02-15 ENCOUNTER — Other Ambulatory Visit: Payer: Medicaid Other

## 2017-02-15 ENCOUNTER — Inpatient Hospital Stay: Payer: Medicaid Other

## 2017-06-05 ENCOUNTER — Inpatient Hospital Stay: Payer: Medicaid Other | Admitting: Family

## 2017-06-05 ENCOUNTER — Inpatient Hospital Stay: Payer: Medicaid Other

## 2017-06-08 ENCOUNTER — Other Ambulatory Visit: Payer: Self-pay | Admitting: *Deleted

## 2017-06-08 DIAGNOSIS — C569 Malignant neoplasm of unspecified ovary: Secondary | ICD-10-CM

## 2017-06-11 ENCOUNTER — Inpatient Hospital Stay: Payer: Medicaid Other | Attending: Hematology & Oncology

## 2017-06-11 ENCOUNTER — Inpatient Hospital Stay: Payer: Medicaid Other

## 2017-06-11 ENCOUNTER — Encounter: Payer: Self-pay | Admitting: Hematology & Oncology

## 2017-06-11 ENCOUNTER — Other Ambulatory Visit: Payer: Self-pay

## 2017-06-11 ENCOUNTER — Inpatient Hospital Stay (HOSPITAL_BASED_OUTPATIENT_CLINIC_OR_DEPARTMENT_OTHER): Payer: Medicaid Other | Admitting: Hematology & Oncology

## 2017-06-11 VITALS — BP 117/83 | HR 91 | Temp 98.0°F | Resp 18 | Wt 158.0 lb

## 2017-06-11 DIAGNOSIS — R05 Cough: Secondary | ICD-10-CM | POA: Insufficient documentation

## 2017-06-11 DIAGNOSIS — Z9221 Personal history of antineoplastic chemotherapy: Secondary | ICD-10-CM | POA: Diagnosis not present

## 2017-06-11 DIAGNOSIS — R5383 Other fatigue: Secondary | ICD-10-CM

## 2017-06-11 DIAGNOSIS — F419 Anxiety disorder, unspecified: Secondary | ICD-10-CM

## 2017-06-11 DIAGNOSIS — C561 Malignant neoplasm of right ovary: Secondary | ICD-10-CM

## 2017-06-11 DIAGNOSIS — C569 Malignant neoplasm of unspecified ovary: Secondary | ICD-10-CM

## 2017-06-11 DIAGNOSIS — R102 Pelvic and perineal pain: Secondary | ICD-10-CM | POA: Diagnosis not present

## 2017-06-11 DIAGNOSIS — F4011 Social phobia, generalized: Secondary | ICD-10-CM

## 2017-06-11 DIAGNOSIS — R413 Other amnesia: Secondary | ICD-10-CM | POA: Diagnosis not present

## 2017-06-11 DIAGNOSIS — R531 Weakness: Secondary | ICD-10-CM

## 2017-06-11 DIAGNOSIS — R1031 Right lower quadrant pain: Secondary | ICD-10-CM | POA: Insufficient documentation

## 2017-06-11 DIAGNOSIS — R51 Headache: Secondary | ICD-10-CM

## 2017-06-11 DIAGNOSIS — R5381 Other malaise: Secondary | ICD-10-CM | POA: Insufficient documentation

## 2017-06-11 DIAGNOSIS — G8928 Other chronic postprocedural pain: Secondary | ICD-10-CM

## 2017-06-11 DIAGNOSIS — M320 Drug-induced systemic lupus erythematosus: Secondary | ICD-10-CM

## 2017-06-11 DIAGNOSIS — R971 Elevated cancer antigen 125 [CA 125]: Secondary | ICD-10-CM | POA: Insufficient documentation

## 2017-06-11 DIAGNOSIS — Z79899 Other long term (current) drug therapy: Secondary | ICD-10-CM

## 2017-06-11 DIAGNOSIS — M3219 Other organ or system involvement in systemic lupus erythematosus: Secondary | ICD-10-CM

## 2017-06-11 DIAGNOSIS — Z8543 Personal history of malignant neoplasm of ovary: Secondary | ICD-10-CM | POA: Diagnosis not present

## 2017-06-11 DIAGNOSIS — M3210 Systemic lupus erythematosus, organ or system involvement unspecified: Secondary | ICD-10-CM

## 2017-06-11 DIAGNOSIS — I10 Essential (primary) hypertension: Secondary | ICD-10-CM

## 2017-06-11 DIAGNOSIS — D500 Iron deficiency anemia secondary to blood loss (chronic): Secondary | ICD-10-CM

## 2017-06-11 LAB — CMP (CANCER CENTER ONLY)
ALK PHOS: 94 U/L — AB (ref 26–84)
ALT: 13 U/L (ref 10–47)
AST: 22 U/L (ref 11–38)
Albumin: 3.5 g/dL (ref 3.5–5.0)
Anion gap: 4 — ABNORMAL LOW (ref 5–15)
BUN: 7 mg/dL (ref 7–22)
CALCIUM: 8.9 mg/dL (ref 8.0–10.3)
CO2: 28 mmol/L (ref 18–33)
Chloride: 104 mmol/L (ref 98–108)
Creatinine: 0.8 mg/dL (ref 0.60–1.20)
Glucose, Bld: 119 mg/dL — ABNORMAL HIGH (ref 73–118)
POTASSIUM: 3.5 mmol/L (ref 3.3–4.7)
SODIUM: 136 mmol/L (ref 128–145)
TOTAL PROTEIN: 6.6 g/dL (ref 6.4–8.1)
Total Bilirubin: 0.6 mg/dL (ref 0.2–1.6)

## 2017-06-11 LAB — CBC WITH DIFFERENTIAL (CANCER CENTER ONLY)
Basophils Absolute: 0.1 10*3/uL (ref 0.0–0.1)
Basophils Relative: 1 %
Eosinophils Absolute: 0.4 10*3/uL (ref 0.0–0.5)
Eosinophils Relative: 6 %
HCT: 36.3 % (ref 34.8–46.6)
HEMOGLOBIN: 11.6 g/dL (ref 11.6–15.9)
LYMPHS ABS: 3.1 10*3/uL (ref 0.9–3.3)
LYMPHS PCT: 48 %
MCH: 27.2 pg (ref 25.1–34.0)
MCHC: 32 g/dL (ref 31.5–36.0)
MCV: 85.2 fL (ref 79.5–101.0)
Monocytes Absolute: 0.6 10*3/uL (ref 0.1–0.9)
Monocytes Relative: 9 %
NEUTROS PCT: 36 %
Neutro Abs: 2.4 10*3/uL (ref 1.5–6.5)
Platelet Count: 244 10*3/uL (ref 145–400)
RBC: 4.26 MIL/uL (ref 3.70–5.45)
RDW: 16.6 % — ABNORMAL HIGH (ref 11.2–14.5)
WBC: 6.5 10*3/uL (ref 3.9–10.3)

## 2017-06-11 MED ORDER — SODIUM CHLORIDE 0.9% FLUSH
10.0000 mL | INTRAVENOUS | Status: AC | PRN
  Administered 2017-06-11: 10 mL
  Filled 2017-06-11: qty 10

## 2017-06-11 MED ORDER — AMPHETAMINE-DEXTROAMPHETAMINE 30 MG PO TABS
30.0000 mg | ORAL_TABLET | Freq: Two times a day (BID) | ORAL | 0 refills | Status: DC
Start: 2017-06-11 — End: 2017-07-09

## 2017-06-11 MED ORDER — HEPARIN SOD (PORK) LOCK FLUSH 100 UNIT/ML IV SOLN
500.0000 [IU] | INTRAVENOUS | Status: AC | PRN
  Administered 2017-06-11: 500 [IU]
  Filled 2017-06-11: qty 5

## 2017-06-11 MED ORDER — LIDOCAINE 5 % EX PTCH: 1.0000 | MEDICATED_PATCH | CUTANEOUS | 3 refills | Status: AC

## 2017-06-11 MED FILL — AMPHETAMINE SALTS 30 MG TAB: 30 | 30 days supply | Qty: 60 | Fill #0

## 2017-06-11 NOTE — Patient Instructions (Signed)
Implanted Port Home Guide An implanted port is a type of central line that is placed under the skin. Central lines are used to provide IV access when treatment or nutrition needs to be given through a person's veins. Implanted ports are used for long-term IV access. An implanted port may be placed because:  You need IV medicine that would be irritating to the small veins in your hands or arms.  You need long-term IV medicines, such as antibiotics.  You need IV nutrition for a long period.  You need frequent blood draws for lab tests.  You need dialysis.  Implanted ports are usually placed in the chest area, but they can also be placed in the upper arm, the abdomen, or the leg. An implanted port has two main parts:  Reservoir. The reservoir is round and will appear as a small, raised area under your skin. The reservoir is the part where a needle is inserted to give medicines or draw blood.  Catheter. The catheter is a thin, flexible tube that extends from the reservoir. The catheter is placed into a large vein. Medicine that is inserted into the reservoir goes into the catheter and then into the vein.  How will I care for my incision site? Do not get the incision site wet. Bathe or shower as directed by your health care provider. How is my port accessed? Special steps must be taken to access the port:  Before the port is accessed, a numbing cream can be placed on the skin. This helps numb the skin over the port site.  Your health care provider uses a sterile technique to access the port. ? Your health care provider must put on a mask and sterile gloves. ? The skin over your port is cleaned carefully with an antiseptic and allowed to dry. ? The port is gently pinched between sterile gloves, and a needle is inserted into the port.  Only "non-coring" port needles should be used to access the port. Once the port is accessed, a blood return should be checked. This helps ensure that the port  is in the vein and is not clogged.  If your port needs to remain accessed for a constant infusion, a clear (transparent) bandage will be placed over the needle site. The bandage and needle will need to be changed every week, or as directed by your health care provider.  Keep the bandage covering the needle clean and dry. Do not get it wet. Follow your health care provider's instructions on how to take a shower or bath while the port is accessed.  If your port does not need to stay accessed, no bandage is needed over the port.  What is flushing? Flushing helps keep the port from getting clogged. Follow your health care provider's instructions on how and when to flush the port. Ports are usually flushed with saline solution or a medicine called heparin. The need for flushing will depend on how the port is used.  If the port is used for intermittent medicines or blood draws, the port will need to be flushed: ? After medicines have been given. ? After blood has been drawn. ? As part of routine maintenance.  If a constant infusion is running, the port may not need to be flushed.  How long will my port stay implanted? The port can stay in for as long as your health care provider thinks it is needed. When it is time for the port to come out, surgery will be   done to remove it. The procedure is similar to the one performed when the port was put in. When should I seek immediate medical care? When you have an implanted port, you should seek immediate medical care if:  You notice a bad smell coming from the incision site.  You have swelling, redness, or drainage at the incision site.  You have more swelling or pain at the port site or the surrounding area.  You have a fever that is not controlled with medicine.  This information is not intended to replace advice given to you by your health care provider. Make sure you discuss any questions you have with your health care provider. Document  Released: 12/19/2004 Document Revised: 05/27/2015 Document Reviewed: 08/26/2012 Elsevier Interactive Patient Education  2017 [employer]  
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
## 2017-06-12 LAB — CA 125: CANCER ANTIGEN (CA) 125: 163.8 U/mL — AB (ref 0.0–38.1)

## 2017-06-21 ENCOUNTER — Ambulatory Visit (HOSPITAL_BASED_OUTPATIENT_CLINIC_OR_DEPARTMENT_OTHER)
Admission: RE | Admit: 2017-06-21 | Discharge: 2017-06-21 | Disposition: A | Discharge status: Home / Self Care | Payer: Medicaid Other | Source: Ambulatory Visit | Attending: Hematology & Oncology | Admitting: Hematology & Oncology

## 2017-06-21 ENCOUNTER — Inpatient Hospital Stay: Payer: Medicaid Other

## 2017-06-21 DIAGNOSIS — I700 Atherosclerosis of aorta: Secondary | ICD-10-CM | POA: Insufficient documentation

## 2017-06-21 DIAGNOSIS — R590 Localized enlarged lymph nodes: Secondary | ICD-10-CM | POA: Insufficient documentation

## 2017-06-21 DIAGNOSIS — J439 Emphysema, unspecified: Secondary | ICD-10-CM | POA: Diagnosis not present

## 2017-06-21 DIAGNOSIS — I2510 Atherosclerotic heart disease of native coronary artery without angina pectoris: Secondary | ICD-10-CM | POA: Insufficient documentation

## 2017-06-21 DIAGNOSIS — C561 Malignant neoplasm of right ovary: Secondary | ICD-10-CM

## 2017-06-21 MED ORDER — IOPAMIDOL (ISOVUE-300) INJECTION 61%: 100.0000 mL | Freq: Once | INTRAVENOUS | Status: DC | PRN

## 2017-06-21 MED ORDER — IOPAMIDOL (ISOVUE-300) INJECTION 61%
100.0000 mL | Freq: Once | INTRAVENOUS | Status: AC | PRN
  Administered 2017-06-21: 100 mL via INTRAVENOUS

## 2017-06-25 ENCOUNTER — Other Ambulatory Visit: Payer: Self-pay | Admitting: Hematology & Oncology

## 2017-06-25 DIAGNOSIS — C561 Malignant neoplasm of right ovary: Secondary | ICD-10-CM

## 2017-06-28 ENCOUNTER — Telehealth: Payer: Self-pay | Admitting: *Deleted

## 2017-06-28 NOTE — Telephone Encounter (Signed)
Called, spoke with the patient and her husband gave the appt for July 2nd at 11am. Patient to arrive at 10:30am to fill out new patient pack

## 2017-06-29 MED FILL — LIDOCAINE PATCH 5%: 5 | 30 days supply | Qty: 30 | Fill #0

## 2017-07-02 NOTE — Progress Notes (Deleted)
Consult Note: GYN-ONC New Patient FIRST VISIT  Consult was requested by Dr. Marland Kitchen   CC: No chief complaint on file.   HPI: Ms. [name]  is a *** nice 52 y.o.  ***  ***  2005 underwent diagnostic laparoscopy and removal of a left hydrosalpinx. Path confirmed benign left fallopian tube. Both ovaries and the right tube noted to be normal. She was 52 yo at that time. Some records erroneously report the left ovary was removed. This is not the case based on operative and pathology reports.  Then 07/2013 had a right salpingectomy for "torsion" and ovary not removed. At same procedure had a left paratubal cyst removed. That procedure resulted in a diagnosis of fallopian tube high grade serous adenocarcinoma. She was seen by [facility] and dispositioned to either completion staging and/or chemotherapy. Again some records are confusing as to whether the right ovary was removed, but review of the pathology reveals it was not.  Thus she has both ovaries in-situ and her uterus and she did not undergo lymph node removal at the time of her tubal CA diagnosis as it was done for presumed benign indications by a general GYN physician.  She received *** cycles of Carbo/Taxol.  Recent followup with Heme-Onc complaining of pain. Imaging with CT revealed a new right adnexal mass and her CA125 is noted to be elevating.  She is referred for Gyn Onc recommendations.  ? Genetics***  Imported EPIC Oncologic History: ***   No history exists.    Measurement of disease:  Recent Labs    01/19/17 1527 06/11/17 1250  CAN125 81.2* 163.8*    *** . ***  Radiology: . ***  Current Meds:  Outpatient Encounter Medications as of 07/03/2017  Medication Sig  . albuterol (PROVENTIL HFA;VENTOLIN HFA) 108 (90 BASE) MCG/ACT inhaler Inhale 2 puffs into the lungs every 6 (six) hours as needed for wheezing.  Marland Kitchen alprazolam (XANAX) 2 MG tablet Take 1 tablet (2 mg total) by mouth at bedtime as needed for anxiety.  Marland Kitchen  amphetamine-dextroamphetamine (ADDERALL) 30 MG tablet Take 1 tablet by mouth 2 (two) times daily.  Marland Kitchen buPROPion (WELLBUTRIN XL) 300 MG 24 hr tablet Take by mouth.  . clindamycin (CLINDAGEL) 1 % gel Apply topically 2 (two) times daily.  . cyclobenzaprine (FLEXERIL) 10 MG tablet Take 10 mg by mouth daily.  Marland Kitchen dicyclomine (BENTYL) 20 MG tablet Take 20 mg by mouth 3 (three) times daily as needed.  . fluticasone (FLONASE) 50 MCG/ACT nasal spray Place 2 sprays into the nose daily.  Marland Kitchen gabapentin (NEURONTIN) 300 MG capsule Take 300 mg by mouth 3 (three) times daily.  Marland Kitchen lidocaine (LIDODERM) 5 % Place 1 patch onto the skin daily. Remove & Discard patch within 12 hours or as directed by MD  . Lidocaine-Hydrocortisone Ace 3-2.5 % KIT Place rectally.  . Magnesium Oxide 250 MG TABS Take 250 mg by mouth daily.  . meclizine (ANTIVERT) 25 MG tablet Take 25 mg by mouth 3 (three) times daily as needed.  . meloxicam (MOBIC) 15 MG tablet Take 15 mg by mouth daily.  . metoprolol succinate (TOPROL-XL) 50 MG 24 hr tablet Take 50 mg by mouth daily. Take with or immediately following a meal.  . naloxegol oxalate (MOVANTIK) 25 MG TABS tablet Take by mouth daily.  . OMEGA 3 1000 MG CAPS Take 2 capsules by mouth daily.  Marland Kitchen omeprazole (PRILOSEC) 20 MG capsule Take 20 mg by mouth daily.  . ondansetron (ZOFRAN) 24 MG tablet Take 24  mg by mouth once.  Marland Kitchen oxybutynin (DITROPAN) 5 MG tablet Take 5 mg by mouth.  . phenazopyridine (PYRIDIUM) 200 MG tablet Take 200 mg by mouth.  . Prenatal Vit-Fe Fumarate-FA (PRENAPLUS) 27-1 MG TABS Take 1 tablet by mouth daily.  . Prenatal Vit-Fe Fumarate-FA (PRENATAL 1+1 PO) Take by mouth.  . sertraline (ZOLOFT) 100 MG tablet Take 100 mg by mouth 2 (two) times daily.  Marland Kitchen tretinoin (RETIN-A) 0.025 % cream Apply topically at bedtime.  . vitamin E 100 UNIT capsule Take 100 Units by mouth daily.  Marland Kitchen zolpidem (AMBIEN) 10 MG tablet Take 1 tablet (10 mg total) by mouth at bedtime as needed for sleep.   No  facility-administered encounter medications on file as of 07/03/2017.     Allergy:  Allergies  Allergen Reactions  . Hydroxychloroquine Other (See Comments) and Rash    Seizures Seizure Seizures Seizure Seizure Seizure Seizures   . Latex Hives and Rash  . Hydroxychloroquine Sulfate Other (See Comments)    Seizure  Seizure    Social Hx:  Tobacco use: *** Alcohol use: *** Illicit Drug use: *** Illicit IV Drug use: ***  Past Surgical Hx:  Past Surgical History:  Procedure Laterality Date  . ABDOMINAL HYSTERECTOMY    . APPENDECTOMY    . HYSTEROSCOPY W/ ENDOMETRIAL ABLATION    . LYMPH NODE DISSECTION    . OOPHORECTOMY      Past Medical Hx:  Past Medical History:  Diagnosis Date  . Arthritis   . Blood transfusion without reported diagnosis   . Cancer ([place])   . Endometriosis   . Hypertension   . Lupus ([place])   . MRSA (methicillin resistant Staphylococcus aureus) 12/2013  . Ovary, ovarian pedicle and fallopian tube torsion   . RA (rheumatoid arthritis) ([place])   . Seizure [facility])     Past Gynecological History:   GYNECOLOGIC HISTORY:  Patient's last menstrual period was 01/22/2012. Menarche: *** years old P *** LMP *** Contraceptive*** HRT ***  Last Pap ***  Family Hx: No family history on file.  Review of Systems:  Review of Systems - Oncology ***  Vitals:  Last menstrual period 01/22/2012. There is no height or weight on file to calculate BMI.   Physical Exam: ECOG PERFORMANCE STATUS: {CHL ONC ECOG WN:[id number]}   General :  ***Well developed, 52 y.o., female in no apparent distress HEENT:  Normocephalic/atraumatic, symmetric, EOMI, eyelids normal Neck:   Supple, no masses.  Lymphatics:  No cervical/ submandibular/ supraclavicular/ infraclavicular/ inguinal adenopathy Respiratory:  Respirations unlabored, no use of accessory muscles CV:   Deferred Breast:  Deferred Musculoskeletal: No CVA tenderness, normal muscle strength. Abdomen:  *** Soft,  non-tender and nondistended. No evidence of hernia. No masses. Extremities:  No lymphedema, no erythema, non-tender. Skin:   Normal inspection Neuro/Psych:  No focal motor deficit, no abnormal mental status. Normal gait. Normal affect. Alert and oriented to person, place, and time  Genito Urinary: Vulva: ***Normal external female genitalia.  Bladder/urethra: Urethral meatus normal in size and location. No lesions or   masses, well supported bladder ***Speculum exam: Vagina: ***No lesion, no discharge, no bleeding. Cervix: ***Normal appearing, no lesions. Bimanual exam: *** Uterus: ***Normal size, mobile.  Adnexa: ***No masses. Rectovaginal:  ***Good tone, no masses, no cul de sac nodularity, no parametrial involvement or nodularity.    Assessment/Plan: 1. *** o *** 2. *** o *** 3. *** o *** 4. *** o *** 5. *** o ***   Isabel Caprice, MD  07/02/2017, 5:22  PM    Cc: *** (Referring ***) *** (PCP)

## 2017-07-03 ENCOUNTER — Inpatient Hospital Stay: Payer: Medicaid Other | Admitting: Obstetrics

## 2017-07-04 ENCOUNTER — Encounter: Payer: Self-pay | Admitting: Oncology

## 2017-07-04 ENCOUNTER — Inpatient Hospital Stay: Payer: Medicaid Other | Attending: Obstetrics | Admitting: Obstetrics

## 2017-07-04 ENCOUNTER — Other Ambulatory Visit (HOSPITAL_COMMUNITY)
Admission: RE | Admit: 2017-07-04 | Discharge: 2017-07-04 | Disposition: A | Discharge status: Home / Self Care | Payer: Medicaid Other | Source: Ambulatory Visit | Attending: Obstetrics | Admitting: Obstetrics

## 2017-07-04 ENCOUNTER — Encounter: Payer: Self-pay | Admitting: Obstetrics

## 2017-07-04 VITALS — BP 110/74 | HR 107 | Temp 98.2°F | Resp 20 | Ht 68.0 in | Wt 157.7 lb

## 2017-07-04 DIAGNOSIS — N949 Unspecified condition associated with female genital organs and menstrual cycle: Secondary | ICD-10-CM | POA: Diagnosis not present

## 2017-07-04 DIAGNOSIS — Z124 Encounter for screening for malignant neoplasm of cervix: Secondary | ICD-10-CM | POA: Insufficient documentation

## 2017-07-04 DIAGNOSIS — N9489 Other specified conditions associated with female genital organs and menstrual cycle: Secondary | ICD-10-CM

## 2017-07-04 NOTE — Patient Instructions (Signed)
Preparing for your Surgery  Plan for surgery on July 10, 2017 with Dr. Precious Haws at [place] will be scheduled for an exploratory laparotomy, bilateral salpingo-oophorectomy, tumor debulking, possible bowel resection.  Pre-operative Testing -You will receive a phone call from presurgical testing at [facility] to arrange for a pre-operative testing appointment before your surgery.  This appointment normally occurs one to two weeks before your scheduled surgery.   -Bring your insurance card, copy of an advanced directive if applicable, medication list  -At that visit, you will be asked to sign a consent for a possible blood transfusion in case a transfusion becomes necessary during surgery.  The need for a blood transfusion is rare but having consent is a necessary part of your care.     -You should not be taking blood thinners or aspirin at least ten days prior to surgery unless instructed by your surgeon.  Day Before Surgery at [place] will be asked to take in a light diet the day before surgery.  Avoid carbonated beverages.  You will be advised to have nothing to eat or drink after midnight the evening before.    Eat a light diet the day before surgery.  Examples including soups, broths, toast, yogurt, mashed potatoes.  Things to avoid include carbonated beverages (fizzy beverages), raw fruits and raw vegetables, or beans.   If your bowels are filled with gas, your surgeon will have difficulty visualizing your pelvic organs which increases your surgical risks.  Your role in recovery Your role is to become active as soon as directed by your doctor, while still giving yourself time to heal.  Rest when you feel tired. You will be asked to do the following in order to speed your recovery:  - Cough and breathe deeply. This helps toclear and expand your lungs and can prevent pneumonia. You may be given a spirometer to practice deep breathing. A staff  member will show you how to use the spirometer. - Do mild physical activity. Walking or moving your legs help your circulation and body functions return to normal. A staff member will help you when you try to walk and will provide you with simple exercises. Do not try to get up or walk alone the first time. - Actively manage your pain. Managing your pain lets you move in comfort. We will ask you to rate your pain on a scale of zero to 10. It is your responsibility to tell your doctor or nurse where and how much you hurt so your pain can be treated.  Special Considerations -If you are diabetic, you may be placed on insulin after surgery to have closer control over your blood sugars to promote healing and recovery.  This does not mean that you will be discharged on insulin.  If applicable, your oral antidiabetics will be resumed when you are tolerating a solid diet.  -Your final pathology results from surgery should be available by the Friday after surgery and the results will be relayed to you when available.  -Dr. Lahoma Crocker is the Surgeon that assists your GYN Oncologist with surgery.  The next day after your surgery you will either see your GYN Oncologist or Dr. Lahoma Crocker.   Blood Transfusion Information WHAT IS A BLOOD TRANSFUSION? A transfusion is the replacement of blood or some of its parts. Blood is made up of multiple cells which provide different functions.  Red blood cells carry oxygen and are used for blood loss  replacement.  White blood cells fight against infection.  Platelets control bleeding.  Plasma helps clot blood.  Other blood products are available for specialized needs, such as hemophilia or other clotting disorders. BEFORE THE TRANSFUSION  Who gives blood for transfusions?   You may be able to donate blood to be used at a later date on yourself (autologous donation).  Relatives can be asked to donate blood. This is generally not any safer than if  you have received blood from a stranger. The same precautions are taken to ensure safety when a relative's blood is donated.  Healthy volunteers who are fully evaluated to make sure their blood is safe. This is blood bank blood. Transfusion therapy is the safest it has ever been in the practice of medicine. Before blood is taken from a donor, a complete history is taken to make sure that person has no history of diseases nor engages in risky social behavior (examples are intravenous drug use or sexual activity with multiple partners). The donor's travel history is screened to minimize risk of transmitting infections, such as malaria. The donated blood is tested for signs of infectious diseases, such as HIV and hepatitis. The blood is then tested to be sure it is compatible with you in order to minimize the chance of a transfusion reaction. If you or a relative donates blood, this is often done in anticipation of surgery and is not appropriate for emergency situations. It takes many days to process the donated blood. RISKS AND COMPLICATIONS Although transfusion therapy is very safe and saves many lives, the main dangers of transfusion include:   Getting an infectious disease.  Developing a transfusion reaction. This is an allergic reaction to something in the blood you were given. Every precaution is taken to prevent this. The decision to have a blood transfusion has been considered carefully by your caregiver before blood is given. Blood is not given unless the benefits outweigh the risks.
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
## 2017-07-09 ENCOUNTER — Telehealth: Payer: Self-pay | Admitting: Oncology

## 2017-07-09 ENCOUNTER — Other Ambulatory Visit: Payer: Self-pay | Admitting: *Deleted

## 2017-07-09 ENCOUNTER — Inpatient Hospital Stay (HOSPITAL_COMMUNITY)
Admission: RE | Admit: 2017-07-09 | Discharge: 2017-07-09 | Disposition: A | Discharge status: Home / Self Care | Payer: Medicaid Other | Source: Ambulatory Visit

## 2017-07-09 ENCOUNTER — Encounter: Payer: Self-pay | Admitting: Obstetrics

## 2017-07-09 DIAGNOSIS — M3219 Other organ or system involvement in systemic lupus erythematosus: Secondary | ICD-10-CM

## 2017-07-09 DIAGNOSIS — G8928 Other chronic postprocedural pain: Secondary | ICD-10-CM

## 2017-07-09 DIAGNOSIS — I10 Essential (primary) hypertension: Secondary | ICD-10-CM

## 2017-07-09 DIAGNOSIS — C569 Malignant neoplasm of unspecified ovary: Secondary | ICD-10-CM

## 2017-07-09 DIAGNOSIS — M3210 Systemic lupus erythematosus, organ or system involvement unspecified: Secondary | ICD-10-CM

## 2017-07-09 DIAGNOSIS — F4011 Social phobia, generalized: Secondary | ICD-10-CM

## 2017-07-09 DIAGNOSIS — M320 Drug-induced systemic lupus erythematosus: Secondary | ICD-10-CM

## 2017-07-09 LAB — CYTOLOGY - PAP
ADEQUACY: ABSENT
Diagnosis: NEGATIVE

## 2017-07-09 MED ORDER — AMPHETAMINE-DEXTROAMPHETAMINE 30 MG PO TABS: 30.0000 mg | ORAL_TABLET | Freq: Two times a day (BID) | ORAL | 0 refills | Status: DC

## 2017-07-09 NOTE — Patient Instructions (Signed)
[name]  07/09/2017   Your procedure is scheduled on:   Report to [facility] Main  Entrance  Report to admitting at AM    Call this number if you have problems the morning of surgery [phone]   .NOTHING BY MOUTH EXCEPT CLEAR LIQUIDS UNTIL 3 HOURS PRIOR TO [place] SURGERY. PLEASE FINISH ENSURE DRINK PER SURGEON ORDER 3 HOURS PRIOR TO SCHEDULED SURGERY TIME WHICH NEEDS TO BE COMPLETED AT ___0815 am_________.      CLEAR LIQUID DIET  The day before your surgery   Foods Allowed                                                                     Foods Excluded  Coffee and tea, regular and decaf                             liquids that you cannot  Plain Jell-O in any flavor                                             see through such as: Fruit ices (not with fruit pulp)                                     milk, soups, orange juice  Iced Popsicles                                    All solid food Carbonated beverages, regular and diet                                    Cranberry, grape and apple juices Sports drinks like Gatorade Lightly seasoned clear broth or consume(fat free) Sugar, honey syrup  Sample Menu Breakfast                                Lunch                                     Supper Cranberry juice                    Beef broth                            Chicken broth Jell-O                                     Grape juice  Apple juice Coffee or tea                        Jell-O                                      Popsicle                                                Coffee or tea                        Coffee or tea  _____________________________________________________________________    Take these medicines the morning of surgery with A SIP OF WATER: zoloft, oxybutin, omeprazole, metoprolol, gabapentin, flexeril, flonase, inhalers and bring , suboxone                                You may not have any  metal on your body including hair pins and              piercings  Do not wear jewelry, make-up, lotions, powders or perfumes, deodorant             Do not wear nail polish.  Do not shave  48 hours prior to surgery.     Do not bring valuables to the hospital. [place].  Contacts, dentures or bridgework may not be worn into surgery.  Leave suitcase in the car. After surgery it may be brought to your room.                 Please read over the following fact sheets you were given: _____________________________________________________________________           [facility] - Preparing for Surgery Before surgery, you can play an important role.  Because skin is not sterile, your skin needs to be as free of germs as possible.  You can reduce the number of germs on your skin by washing with CHG (chlorahexidine gluconate) soap before surgery.  CHG is an antiseptic cleaner which kills germs and bonds with the skin to continue killing germs even after washing. Please DO NOT use if you have an allergy to CHG or antibacterial soaps.  If your skin becomes reddened/irritated stop using the CHG and inform your nurse when you arrive at Short Stay. Do not shave (including legs and underarms) for at least 48 hours prior to the first CHG shower.  You may shave your face/neck. Please follow these instructions carefully:  1.  Shower with CHG Soap the night before surgery and the  morning of Surgery.  2.  If you choose to wash your hair, wash your hair first as usual with your  normal  shampoo.  3.  After you shampoo, rinse your hair and body thoroughly to remove the  shampoo.                           4.  Use CHG as you would any other liquid soap.  You can apply chg directly  to  the skin and wash                       Gently with a scrungie or clean washcloth.  5.  Apply the CHG Soap to your body ONLY FROM THE NECK DOWN.   Do not use on face/ open                            Wound or open sores. Avoid contact with eyes, ears mouth and genitals (private parts).                       Wash face,  Genitals (private parts) with your normal soap.             6.  Wash thoroughly, paying special attention to the area where your surgery  will be performed.  7.  Thoroughly rinse your body with warm water from the neck down.  8.  DO NOT shower/wash with your normal soap after using and rinsing off  the CHG Soap.                9.  Pat yourself dry with a clean towel.            10.  Wear clean pajamas.            11.  Place clean sheets on your bed the night of your first shower and do not  sleep with pets. Day of Surgery : Do not apply any lotions/deodorants the morning of surgery.  Please wear clean clothes to the hospital/surgery center.  FAILURE TO FOLLOW THESE INSTRUCTIONS MAY RESULT IN THE CANCELLATION OF YOUR SURGERY PATIENT SIGNATURE_________________________________  NURSE SIGNATURE__________________________________  ________________________________________________________________________  WHAT IS A BLOOD TRANSFUSION? Blood Transfusion Information  A transfusion is the replacement of blood or some of its parts. Blood is made up of multiple cells which provide different functions.  Red blood cells carry oxygen and are used for blood loss replacement.  White blood cells fight against infection.  Platelets control bleeding.  Plasma helps clot blood.  Other blood products are available for specialized needs, such as hemophilia or other clotting disorders. BEFORE THE TRANSFUSION  Who gives blood for transfusions?   Healthy volunteers who are fully evaluated to make sure their blood is safe. This is blood bank blood. Transfusion therapy is the safest it has ever been in the practice of medicine. Before blood is taken from a donor, a complete history is taken to make sure that person has no history of diseases nor engages in risky social behavior  (examples are intravenous drug use or sexual activity with multiple partners). The donor's travel history is screened to minimize risk of transmitting infections, such as malaria. The donated blood is tested for signs of infectious diseases, such as HIV and hepatitis. The blood is then tested to be sure it is compatible with you in order to minimize the chance of a transfusion reaction. If you or a relative donates blood, this is often done in anticipation of surgery and is not appropriate for emergency situations. It takes many days to process the donated blood. RISKS AND COMPLICATIONS Although transfusion therapy is very safe and saves many lives, the main dangers of transfusion include:   Getting an infectious disease.  Developing a transfusion reaction. This is an allergic reaction to something in the blood you were given. Every precaution is taken to prevent  this. The decision to have a blood transfusion has been considered carefully by your caregiver before blood is given. Blood is not given unless the benefits outweigh the risks. AFTER THE TRANSFUSION  Right after receiving a blood transfusion, you will usually feel much better and more energetic. This is especially true if your red blood cells have gotten low (anemic). The transfusion raises the level of the red blood cells which carry oxygen, and this usually causes an energy increase.  The nurse administering the transfusion will monitor you carefully for complications. HOME CARE INSTRUCTIONS  No special instructions are needed after a transfusion. You may find your energy is better. Speak with your caregiver about any limitations on activity for underlying diseases you may have. SEEK MEDICAL CARE IF:   Your condition is not improving after your transfusion.  You develop redness or irritation at the intravenous (IV) site. SEEK IMMEDIATE MEDICAL CARE IF:  Any of the following symptoms occur over the next 12 hours:  Shaking  chills.  You have a temperature by mouth above 102 F (38.9 C), not controlled by medicine.  Chest, back, or muscle pain.  People around you feel you are not acting correctly or are confused.  Shortness of breath or difficulty breathing.  Dizziness and fainting.  You get a rash or develop hives.  You have a decrease in urine output.  Your urine turns a dark color or changes to pink, red, or brown. Any of the following symptoms occur over the next 10 days:  You have a temperature by mouth above 102 F (38.9 C), not controlled by medicine.  Shortness of breath.  Weakness after normal activity.  The white part of the eye turns yellow (jaundice).  You have a decrease in the amount of urine or are urinating less often.  Your urine turns a dark color or changes to pink, red, or brown. Document Released: 12/17/1999 Document Revised: 03/13/2011 Document Reviewed: 08/05/2007 ExitCare Patient Information 2014 [place].  _______________________________________________________________________  Incentive Spirometer  An incentive spirometer is a tool that can help keep your lungs clear and active. This tool measures how well you are filling your lungs with each breath. Taking long deep breaths may help reverse or decrease the chance of developing breathing (pulmonary) problems (especially infection) following:  A long period of time when you are unable to move or be active. BEFORE THE PROCEDURE   If the spirometer includes an indicator to show your best effort, your nurse or respiratory therapist will set it to a desired goal.  If possible, sit up straight or lean slightly forward. Try not to slouch.  Hold the incentive spirometer in an upright position. INSTRUCTIONS FOR USE  1. Sit on the edge of your bed if possible, or sit up as far as you can in bed or on a chair. 2. Hold the incentive spirometer in an upright position. 3. Breathe out normally. 4. Place the  mouthpiece in your mouth and seal your lips tightly around it. 5. Breathe in slowly and as deeply as possible, raising the piston or the ball toward the top of the column. 6. Hold your breath for 3-5 seconds or for as long as possible. Allow the piston or ball to fall to the bottom of the column. 7. Remove the mouthpiece from your mouth and breathe out normally. 8. Rest for a few seconds and repeat Steps 1 through 7 at least 10 times every 1-2 hours when you are awake. Take your time and take  a few normal breaths between deep breaths. 9. The spirometer may include an indicator to show your best effort. Use the indicator as a goal to work toward during each repetition. 10. After each set of 10 deep breaths, practice coughing to be sure your lungs are clear. If you have an incision (the cut made at the time of surgery), support your incision when coughing by placing a pillow or rolled up towels firmly against it. Once you are able to get out of bed, walk around indoors and cough well. You may stop using the incentive spirometer when instructed by your caregiver.  RISKS AND COMPLICATIONS  Take your time so you do not get dizzy or light-headed.  If you are in pain, you may need to take or ask for pain medication before doing incentive spirometry. It is harder to take a deep breath if you are having pain. AFTER USE  Rest and breathe slowly and easily.  It can be helpful to keep track of a log of your progress. Your caregiver can provide you with a simple table to help with this. If you are using the spirometer at home, follow these instructions: [place] IF:   You are having difficultly using the spirometer.  You have trouble using the spirometer as often as instructed.  Your pain medication is not giving enough relief while using the spirometer.  You develop fever of 100.5 F (38.1 C) or higher. SEEK IMMEDIATE MEDICAL CARE IF:   You cough up bloody sputum that had not been present  before.  You develop fever of 102 F (38.9 C) or greater.  You develop worsening pain at or near the incision site. MAKE SURE YOU:   Understand these instructions.  Will watch your condition.  Will get help right away if you are not doing well or get worse. Document Released: 05/01/2006 Document Revised: 03/13/2011 Document Reviewed: 07/02/2006 [facility] Patient Information 2014 [place], [place].   ________________________________________________________________________

## 2017-07-09 NOTE — Telephone Encounter (Signed)
Patient's husband, [name], called and said they need to cancel [name]'s preop appointment today.  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  He is concerned about her having surgery in this condition.  He also said they have been trying to process the need for surgery and the large incision she will need.  He said they are worried about her having a colostomy and a hysterectomy.  He also said her ovaries have already been taken out so he is not sure about the surgery.  He said they have not slept since her appointment on Wednesday and have prayed about it and met with family.  He said they need a few days to get her stronger and to process the situation.  He is also trying to talk to her oncologist.  He said he would call back in a few days to talk about rescheduling.

## 2017-07-10 ENCOUNTER — Inpatient Hospital Stay (HOSPITAL_COMMUNITY): Admission: RE | Admit: 2017-07-10 | Payer: Medicaid Other | Source: Ambulatory Visit | Admitting: Obstetrics

## 2017-07-10 ENCOUNTER — Encounter (HOSPITAL_COMMUNITY): Admission: RE | Payer: Self-pay | Source: Ambulatory Visit

## 2017-07-10 ENCOUNTER — Telehealth: Payer: Self-pay

## 2017-07-10 SURGERY — LAPAROTOMY, EXPLORATORY
Anesthesia: General

## 2017-07-10 NOTE — Telephone Encounter (Signed)
Pt notified about pap results: negative.  No questions or concerns voiced.  Patient and husband agreeable to a follow up appointment with Dr. Gerarda Fraction July 24,2019 to discuss plan of care.

## 2017-07-17 ENCOUNTER — Other Ambulatory Visit: Payer: Self-pay | Admitting: *Deleted

## 2017-07-17 DIAGNOSIS — C5700 Malignant neoplasm of unspecified fallopian tube: Secondary | ICD-10-CM

## 2017-07-23 ENCOUNTER — Ambulatory Visit: Payer: Medicaid Other | Admitting: Family

## 2017-07-23 ENCOUNTER — Other Ambulatory Visit: Payer: Medicaid Other

## 2017-07-25 ENCOUNTER — Inpatient Hospital Stay: Payer: Medicaid Other | Admitting: Obstetrics

## 2017-08-02 ENCOUNTER — Inpatient Hospital Stay: Payer: Medicaid Other

## 2017-08-02 ENCOUNTER — Inpatient Hospital Stay: Payer: Medicaid Other | Attending: Obstetrics

## 2017-08-02 ENCOUNTER — Inpatient Hospital Stay: Payer: Medicaid Other | Admitting: Hematology & Oncology

## 2017-08-08 ENCOUNTER — Other Ambulatory Visit: Payer: Self-pay | Admitting: *Deleted

## 2017-08-08 DIAGNOSIS — G8928 Other chronic postprocedural pain: Secondary | ICD-10-CM

## 2017-08-08 DIAGNOSIS — C569 Malignant neoplasm of unspecified ovary: Secondary | ICD-10-CM

## 2017-08-08 DIAGNOSIS — M3219 Other organ or system involvement in systemic lupus erythematosus: Secondary | ICD-10-CM

## 2017-08-08 DIAGNOSIS — M320 Drug-induced systemic lupus erythematosus: Secondary | ICD-10-CM

## 2017-08-08 DIAGNOSIS — I10 Essential (primary) hypertension: Secondary | ICD-10-CM

## 2017-08-08 DIAGNOSIS — F4011 Social phobia, generalized: Secondary | ICD-10-CM

## 2017-08-08 DIAGNOSIS — M3210 Systemic lupus erythematosus, organ or system involvement unspecified: Secondary | ICD-10-CM

## 2017-08-08 MED ORDER — AMPHETAMINE-DEXTROAMPHETAMINE 30 MG PO TABS: 30.0000 mg | ORAL_TABLET | Freq: Two times a day (BID) | ORAL | 0 refills | Status: DC

## 2017-09-05 ENCOUNTER — Other Ambulatory Visit: Payer: Self-pay | Admitting: *Deleted

## 2017-09-05 DIAGNOSIS — M320 Drug-induced systemic lupus erythematosus: Secondary | ICD-10-CM

## 2017-09-05 DIAGNOSIS — M3210 Systemic lupus erythematosus, organ or system involvement unspecified: Secondary | ICD-10-CM

## 2017-09-05 DIAGNOSIS — F4011 Social phobia, generalized: Secondary | ICD-10-CM

## 2017-09-05 DIAGNOSIS — G8928 Other chronic postprocedural pain: Secondary | ICD-10-CM

## 2017-09-05 DIAGNOSIS — C569 Malignant neoplasm of unspecified ovary: Secondary | ICD-10-CM

## 2017-09-05 DIAGNOSIS — I10 Essential (primary) hypertension: Secondary | ICD-10-CM

## 2017-09-05 DIAGNOSIS — M3219 Other organ or system involvement in systemic lupus erythematosus: Secondary | ICD-10-CM

## 2017-09-05 MED ORDER — AMPHETAMINE-DEXTROAMPHETAMINE 30 MG PO TABS: 30.0000 mg | ORAL_TABLET | Freq: Two times a day (BID) | ORAL | 0 refills | Status: DC

## 2017-09-05 MED FILL — AMPHETAMINE SALTS 30 MG TAB: 30 | 30 days supply | Qty: 60 | Fill #0

## 2017-10-08 ENCOUNTER — Other Ambulatory Visit: Payer: Self-pay | Admitting: *Deleted

## 2017-10-08 DIAGNOSIS — I10 Essential (primary) hypertension: Secondary | ICD-10-CM

## 2017-10-08 DIAGNOSIS — G8928 Other chronic postprocedural pain: Secondary | ICD-10-CM

## 2017-10-08 DIAGNOSIS — M320 Drug-induced systemic lupus erythematosus: Secondary | ICD-10-CM

## 2017-10-08 DIAGNOSIS — F4011 Social phobia, generalized: Secondary | ICD-10-CM

## 2017-10-08 DIAGNOSIS — C569 Malignant neoplasm of unspecified ovary: Secondary | ICD-10-CM

## 2017-10-08 DIAGNOSIS — M3219 Other organ or system involvement in systemic lupus erythematosus: Secondary | ICD-10-CM

## 2017-10-08 DIAGNOSIS — M3210 Systemic lupus erythematosus, organ or system involvement unspecified: Secondary | ICD-10-CM

## 2017-10-08 MED ORDER — AMPHETAMINE-DEXTROAMPHETAMINE 30 MG PO TABS: 30.0000 mg | ORAL_TABLET | Freq: Two times a day (BID) | ORAL | 0 refills | Status: DC

## 2017-10-09 ENCOUNTER — Other Ambulatory Visit: Payer: Self-pay | Admitting: *Deleted

## 2017-10-09 DIAGNOSIS — M320 Drug-induced systemic lupus erythematosus: Secondary | ICD-10-CM

## 2017-10-09 DIAGNOSIS — M3210 Systemic lupus erythematosus, organ or system involvement unspecified: Secondary | ICD-10-CM

## 2017-10-09 DIAGNOSIS — M3219 Other organ or system involvement in systemic lupus erythematosus: Secondary | ICD-10-CM

## 2017-10-09 DIAGNOSIS — C569 Malignant neoplasm of unspecified ovary: Secondary | ICD-10-CM

## 2017-10-09 DIAGNOSIS — I10 Essential (primary) hypertension: Secondary | ICD-10-CM

## 2017-10-09 DIAGNOSIS — F4011 Social phobia, generalized: Secondary | ICD-10-CM

## 2017-10-09 DIAGNOSIS — G8928 Other chronic postprocedural pain: Secondary | ICD-10-CM

## 2017-10-09 MED ORDER — AMPHETAMINE-DEXTROAMPHETAMINE 30 MG PO TABS: 30.0000 mg | ORAL_TABLET | Freq: Two times a day (BID) | ORAL | 0 refills | Status: DC

## 2017-11-05 ENCOUNTER — Other Ambulatory Visit: Payer: Self-pay | Admitting: *Deleted

## 2017-11-05 ENCOUNTER — Other Ambulatory Visit: Payer: Medicaid Other

## 2017-11-05 ENCOUNTER — Ambulatory Visit: Payer: Medicaid Other | Admitting: Hematology & Oncology

## 2017-11-05 DIAGNOSIS — G8928 Other chronic postprocedural pain: Secondary | ICD-10-CM

## 2017-11-05 DIAGNOSIS — I10 Essential (primary) hypertension: Secondary | ICD-10-CM

## 2017-11-05 DIAGNOSIS — M3219 Other organ or system involvement in systemic lupus erythematosus: Secondary | ICD-10-CM

## 2017-11-05 DIAGNOSIS — M320 Drug-induced systemic lupus erythematosus: Secondary | ICD-10-CM

## 2017-11-05 DIAGNOSIS — C569 Malignant neoplasm of unspecified ovary: Secondary | ICD-10-CM

## 2017-11-05 DIAGNOSIS — M3210 Systemic lupus erythematosus, organ or system involvement unspecified: Secondary | ICD-10-CM

## 2017-11-05 DIAGNOSIS — F4011 Social phobia, generalized: Secondary | ICD-10-CM

## 2017-11-05 MED ORDER — AMPHETAMINE-DEXTROAMPHETAMINE 30 MG PO TABS: 30.0000 mg | ORAL_TABLET | Freq: Two times a day (BID) | ORAL | 0 refills | Status: AC

## 2017-11-12 ENCOUNTER — Inpatient Hospital Stay: Payer: Medicaid Other | Attending: Obstetrics

## 2017-11-12 ENCOUNTER — Other Ambulatory Visit: Payer: Medicaid Other

## 2017-11-12 ENCOUNTER — Inpatient Hospital Stay: Payer: Medicaid Other | Admitting: Hematology & Oncology

## 2017-12-06 ENCOUNTER — Other Ambulatory Visit: Payer: Self-pay | Admitting: *Deleted

## 2017-12-06 ENCOUNTER — Telehealth: Payer: Self-pay | Admitting: Hematology & Oncology

## 2017-12-06 ENCOUNTER — Telehealth: Payer: Self-pay | Admitting: *Deleted

## 2017-12-06 NOTE — Telephone Encounter (Signed)
Patient's husband calling the office for refill on patient's Adderall.   The last several months, patient has been no-show for appointments. She has not been seen since 07/17/2017. Each time husband states patient wasn't feeling well enough to come for appointment, and more recently has undergone surgery and treatment for her cancer at [facility]. When husband called last month, he sated he needed "just one more prescription" as he had finally gotten her schedule to establish care with a psychiatrist. Patient was a no-show on 11/12/17. Husband calling today wanting a refill on her Adderall stating patient is just "going through so much, and she just really needs this medication because it helps her so much". Patient did not make appointment with the psychiatrist.   This office will not refill Adderall at this time. She is under current and active care by physicians at [facility] and they can assume this prescription if they feel it's appropriate. Provider may discuss providing prescription at her next appointment, currently scheduled for 12/27/2017.   When patient's husband notified that refill will not be provided he became very upset. He spoke for over 15 minutes about everything going on and how important this medication is. Several times he stated "This medication is very important to me. I can't go on without it". He ended the call angry that he was unable to get refill stating that we were treating [name] "like a number, I guess she's no longer a patient".  Dr Marin Olp notified of all the above

## 2017-12-06 NOTE — Telephone Encounter (Signed)
I left a lengthy message on Ms. Zylka's home phone or cell phone.  I just wanted to clarify with her that we cannot prescribe her controlled substances because we are not treating her for her cancer.  I said that because of DEA rules, only treating physicians have the ability to prescribe controlled substances for cancer patients.  I told her I felt bad that I could not help her out but I am bound by government regulations and if I was not adherent to these government regulations, then I could lose my medical license and I just am not going to do that and have my other patients care be jeopardized by me lose my license.  I told her that we will be more than happy to help her and to treat her if necessary as she does live in [place].  I am sure that she is getting fantastic care by the doctors over at [facility].  I just feel that she has to drive such a long way to go to treatments when she probably lives about 10 minutes from our office.  Again, I just wanted to let Ms. Scherzinger know that we are obligated to follow DEA regulations and we just are not able to prescribe her controlled substances at this point.  I will stay strong and prayer for her.  I know that she does have a strong faith and her faith will be rewarded.  Lattie Haw, MD

## 2017-12-27 ENCOUNTER — Other Ambulatory Visit: Payer: Medicaid Other

## 2017-12-27 ENCOUNTER — Ambulatory Visit: Payer: Medicaid Other | Admitting: Hematology & Oncology

## 2018-08-29 ENCOUNTER — Telehealth: Payer: Self-pay | Admitting: *Deleted

## 2018-08-29 ENCOUNTER — Inpatient Hospital Stay: Payer: Medicaid Other | Admitting: Hematology & Oncology

## 2018-08-29 ENCOUNTER — Other Ambulatory Visit: Payer: Self-pay | Admitting: *Deleted

## 2018-08-29 ENCOUNTER — Inpatient Hospital Stay: Payer: Medicaid Other

## 2018-08-29 DIAGNOSIS — C5700 Malignant neoplasm of unspecified fallopian tube: Secondary | ICD-10-CM

## 2018-08-29 NOTE — Telephone Encounter (Signed)
Patient previously seen in this office, but transferred care to [facility]. Her and her husband reached out to the office about 2 weeks ago to see if they could get routine port care and labs drawn here.  Appointment made for today. Reached out to patient yesterday as we had not received request for lab work from [facility]. While speaking to the patient she stated that she had been running a low grade temp for several days. She states "it stays around 100 degrees". She denies any other symptoms.   This morning we received lab orders from [facility]. Called patient and let her know that we received labs and to check on her temperature. She states today she has no temp.  Reviewed the office policy. If she arrives with a temp she will need to reschedule. She needs a mask and no visitors. She understood.

## 2018-08-29 NOTE — Telephone Encounter (Signed)
Patient called stating that she can't find her pocketbook and will be late for her appointment.  Sent a message to the scheduler to reach out to reschedule patient's appointment. Patient requested that the scheduler request that I call her back because she needs a refill on her Adderall.  Previous to this appointment, with considerations to prior issues with this office prescribing controlled substances with this patient, I had already informed both the patient and her husband that Dr Marin Olp would not be prescribing any medication, as she is followed by her [facility] and this office will just be assisting with labs and port maintenance. They stated they understood.  Called patient. I reviewed the above with patient's husband again. He states Dr Leda Quail will not prescribe this medication. Thessaly has an appointment with psychiatry on 09/17/18. He states they ran out of Adderall and he would like a 2 week supply.  Once again explained to the patient that since she is not an active patient we cannot prescribe any medication. Tried to explain that even going forward, no controlled substances would be prescribed as she is under the care of another physician. The husband seemed to understand.  They will see Laverna Peace NP next week.

## 2018-09-02 ENCOUNTER — Encounter: Payer: Self-pay | Admitting: *Deleted

## 2018-09-02 ENCOUNTER — Ambulatory Visit: Payer: Medicaid Other | Admitting: Family

## 2018-09-02 ENCOUNTER — Telehealth: Payer: Self-pay | Admitting: *Deleted

## 2018-09-02 ENCOUNTER — Other Ambulatory Visit: Payer: Medicaid Other

## 2018-09-02 NOTE — Telephone Encounter (Signed)
Received a message from patient stating she forgot when her appointment was, but believes it to be Tuesday at 4pm.   Her appointment was today at 1:45. This is the second appointment that she has missed in less than a week.  Attempted to call patient back to discuss possibilities regarding getting rescheduled but unable to reach patient. Voicemail left. Will follow up tomorrow.

## 2018-09-02 NOTE — Progress Notes (Signed)
Error

## 2018-09-10 ENCOUNTER — Telehealth: Payer: Self-pay | Admitting: *Deleted

## 2018-09-10 NOTE — Telephone Encounter (Signed)
Never received call back from patient regarding missing her three appointments for lab work and port flush.  Called the GYN Oncology navigator at [facility], [name] at [phone]. Patient is there today for her follow up appointment. [name] states that they have informed the patient that they may use [facility] for labs draws, which is close to the patient home and within the [place]. She states that [name] is still managing patient care and that patient should use their network for labs, but if they prefer, we can provide lab draws and port maintenance. [name] also mentions that they have set the patient up with a new primary care physician and psychiatric support.  Confirmed with [name] that we can provide routine lab work, but that [name] will remain as the treating physician. We do not need to provide patient with any prescriptions as these can be covered by her other physicians. If patient is seen here, we can have her seen by the NP and follow orders per [name].

## 2018-11-27 ENCOUNTER — Telehealth: Payer: Self-pay | Admitting: *Deleted

## 2018-11-27 NOTE — Telephone Encounter (Signed)
Received a message from patient's husband, Legrand Como, stating that he needed to get some advice regarding Maitri's condition and diagnosis. He's interested in having her care transferred back to this office.   Patient hasn't been seen in this office since 06/11/2017 and is no longer an active patient. This past September they also reached out to establish care and no-showed to three appointments. At this time, she would need to be considered a new patient referral, and only Dr Maylon Peppers is accepting new patients.   Called patient and spoke to her and her husband on the phone. Patient's primary concern is pain management. They states patient is not happy with Dr [name] pain management, the pain management provided by Dr Megan Salon at the "addiction clinic" (office name per patient) nor the palliative care team's approach to care management which involves holistic approaches such as massages, acupuncture and hot water therapy. She mentions that some physicians try to prescribe fentanyl or morphine, but she doesn't like these medications. She prefers OxyContin. She wants this medication because it doesn't make her feel tired or 'out of it'. Dr Megan Salon has the patient on Neurontin, suboxone, tylenol and flexeril per the patient.   Reviewed common narcotic side effects including fatigued and altered mental status, especially when starting a new pain medication management program, and that often these symptoms would decrease or resolve. Explained that if she has physicians who are offering to prescribe fentanyl or morphine that, if she is in the severe pain she describes, it would probably be beneficial to try these medications on a trial basis. She states she knows too many people who have died from these medications. Educated patient that when these medications are taken as prescribed by a provider, and only used when necessary, poor outcome are rare.   Explained to the patient that Dr Maylon Peppers may accept her as a new  patient, but that he wouldn't be free with narcotic prescriptions either, and by transferring care here, she may not gain any therapeutic benefit, as her only complaint is not receiving proper pain management, namely the prescribing of OxyContin. I told her I would speak to Dr Maylon Peppers, as well as the GYN clinic at [facility] to see what her options were and reach back out to her next week.   A total of 42 minutes was spent on the phone with patient and her husband. The vast majority of it spent discussing her pain management challenges.   I will follow up with Dr Maylon Peppers and the [facility] and call patient next week with possible options.

## 2018-12-04 ENCOUNTER — Telehealth: Payer: Self-pay | Admitting: *Deleted

## 2018-12-04 NOTE — Telephone Encounter (Addendum)
Spoke to Elmo Putt, the [employer] at [facility]. Her and Dr Alvy Bimler reviewed the chart, and felt like their clinic couldn't offer anything additional for her care at this time. Dr Alvy Bimler doesn't treat chronic pain issues, and also mentioned that she only uses Morphine based medication to treat acute pain, which is something that this patient was looking to avoid.  Spoke to Dr Maylon Peppers. He also reviewed chart and stated that he does not treat gynecological cancers and that she would be better served remaining at [facility] where she is recieving standard of care. We didn't have anything additional to offer. Dr Maylon Peppers stated he wouldn't interfere with Dr Hale Bogus pain management program.   Notified patient's husband to the above. I was unable to reach him, but left a detailed message on his personal voice mail.

## 2019-01-17 IMAGING — CT CT CHEST W/ CM
2 of 5 series · 14 of 36 positions shown, 17 images · IV contrast (APPLIED)
Comparison: 05/05/2016 abdominopelvic CT.  12/16/2015 chest CT.

CLINICAL DATA: Lower abdominal pain. Ovarian/fallopian tube/
peritoneal cancer suspected.

EXAM:
CT CHEST, ABDOMEN, AND PELVIS WITH CONTRAST
TECHNIQUE: Multidetector CT imaging of the chest, abdomen and pelvis was
performed following the standard protocol during bolus
administration of intravenous contrast.
CONTRAST:  100mL [id number] IOPAMIDOL ([id number]) INJECTION 61%

[Series 2: cap with 2 · axial · 0.98mm/px · z∈[-603,-33]mm · 11 of 138 slices shown, 14 images]
[im 12/138  mediastinal]
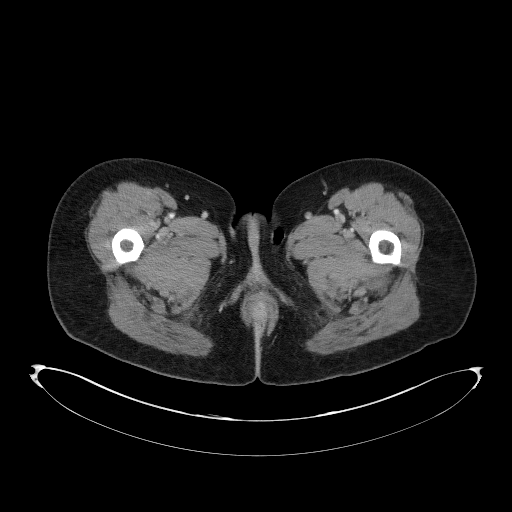
[im 12/138  lung]
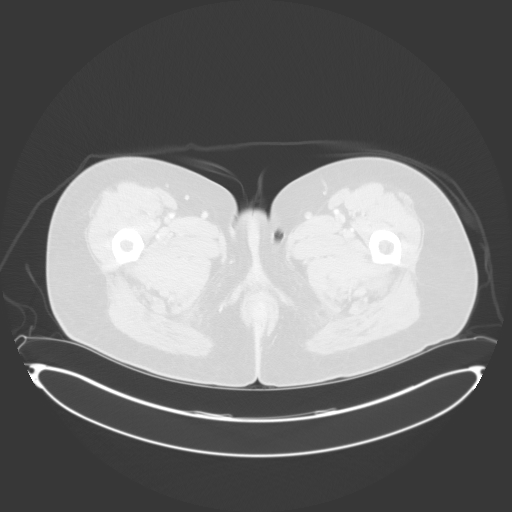
[im 23/138  lung]
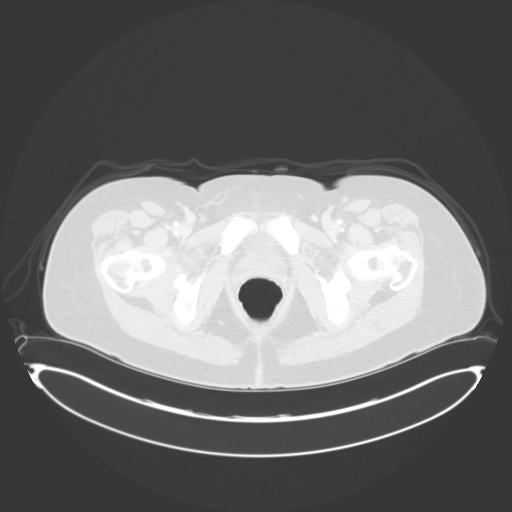
[im 35/138  lung]
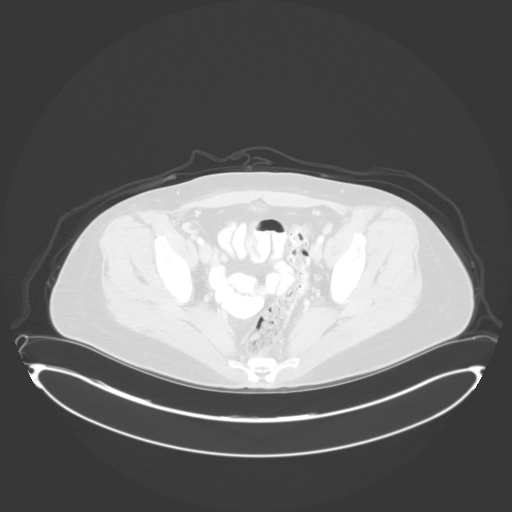
[im 46/138  lung]
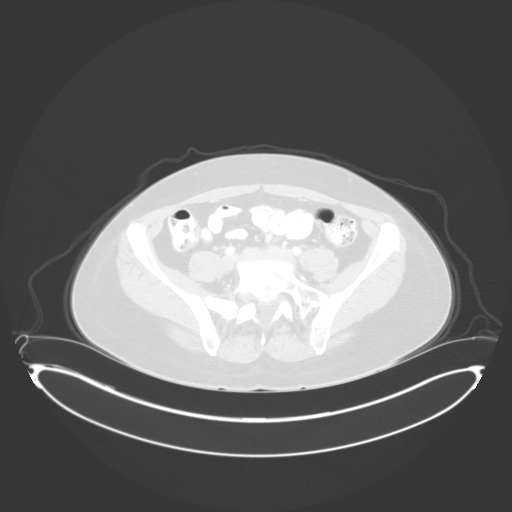
[im 58/138  mediastinal]
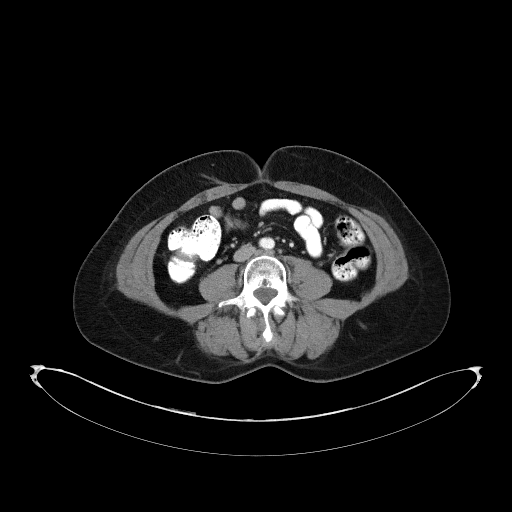
[im 58/138  lung]
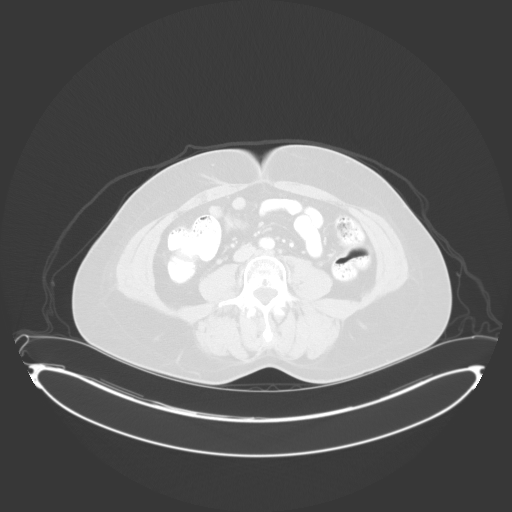
[im 69/138  lung]
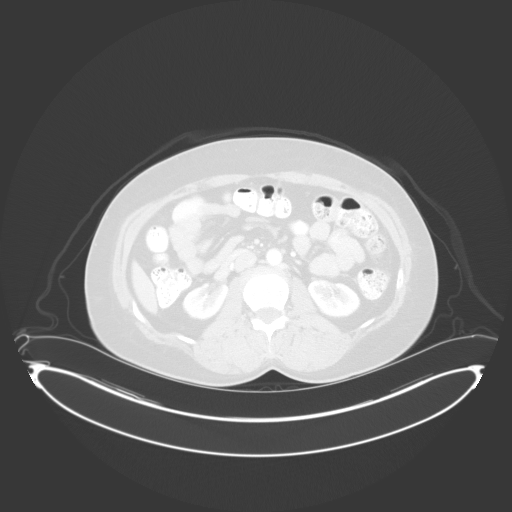
[im 80/138  lung]
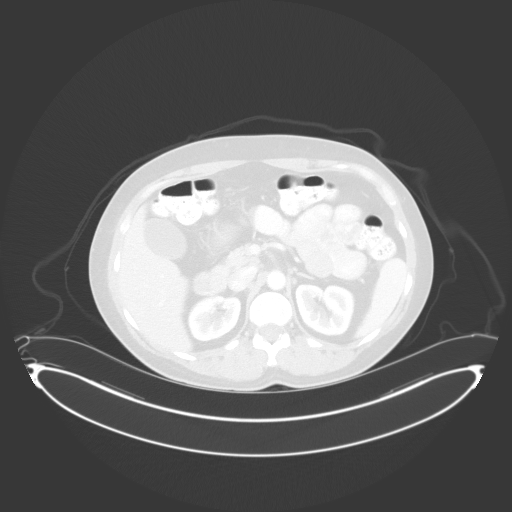
[im 92/138  lung]
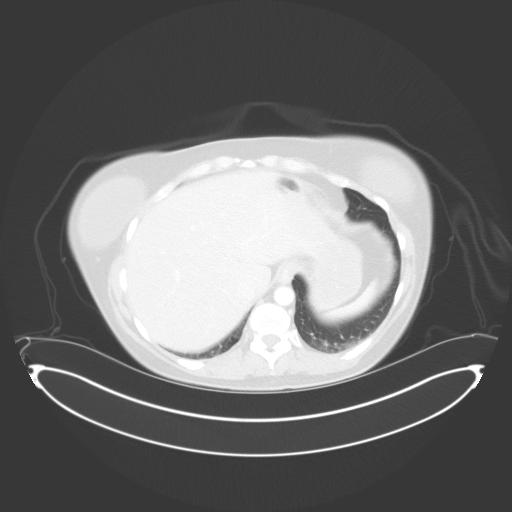
[im 103/138  mediastinal]
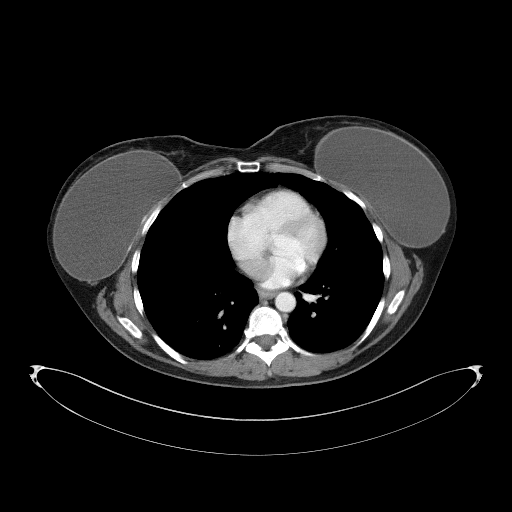
[im 103/138  lung]
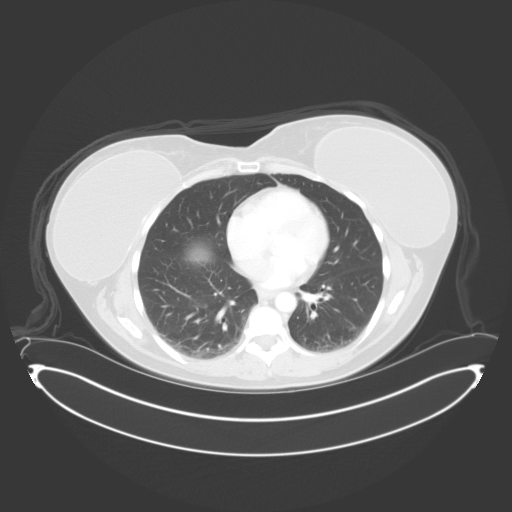
[im 115/138  lung]
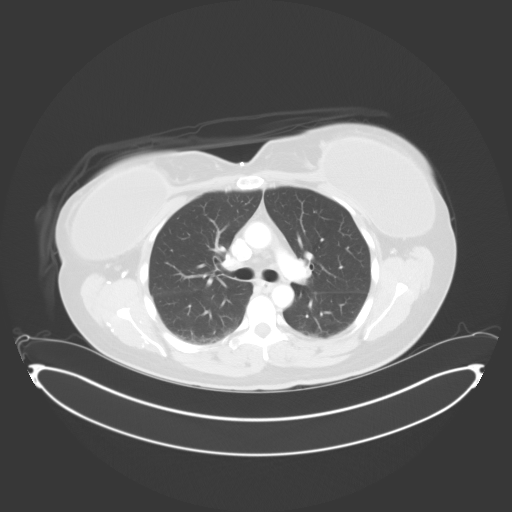
[im 126/138  lung]
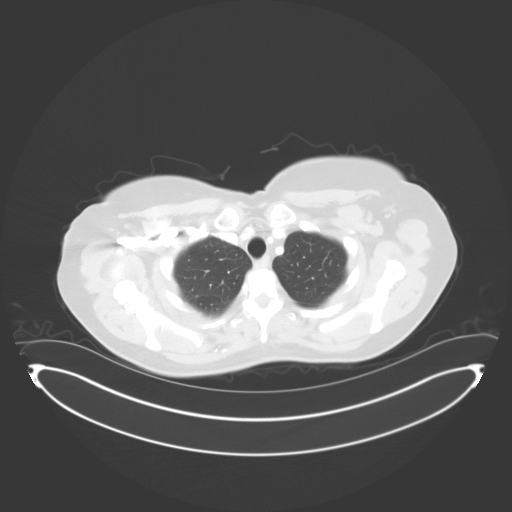

[Series 4: coronals · coronal · 0.95mm/px · 3 of 138 slices shown]
[im 28/138  lung]
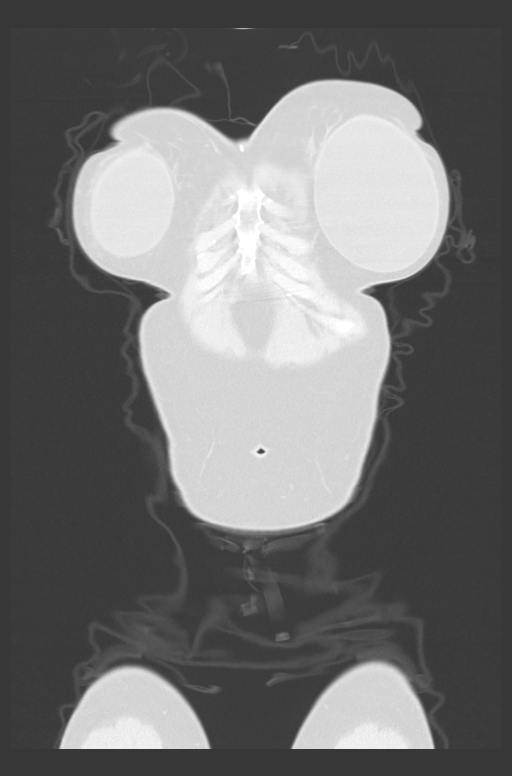
[im 55/138  lung]
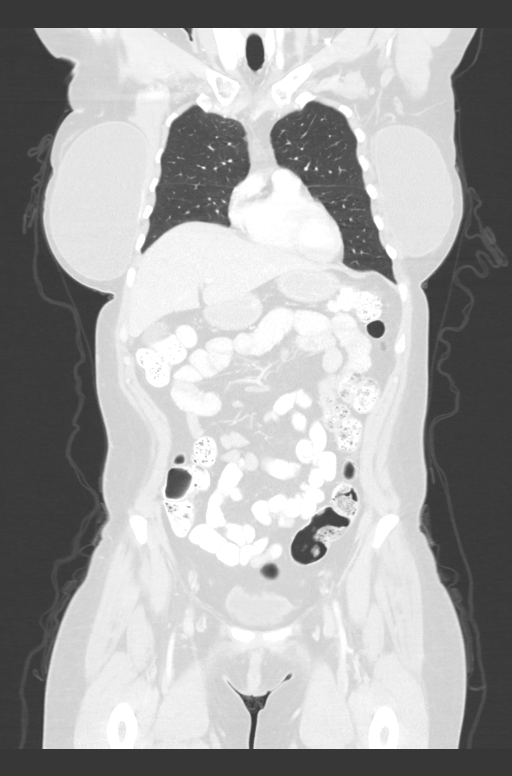
[im 83/138  lung]
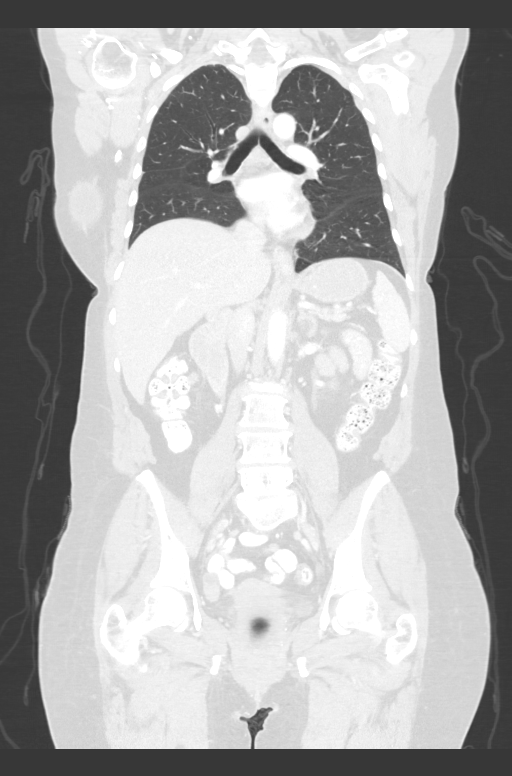

[14 of 36 positions shown; findings below may reference images not displayed]

FINDINGS: CT CHEST FINDINGS

Cardiovascular: Normal heart size, without pericardial effusion.
Suspect lad coronary artery atherosclerosis on image 32/series 2. No
central pulmonary embolism, on this non-dedicated study. The
left-sided Port-A-Cath terminates at the high right atrium. The port
is positioned in a sagittal plane today versus an oblique plane on
09/17/2015 and a relatively normal coronal plane on 12/16/2015.

Mediastinum/Nodes: No supraclavicular adenopathy. No mediastinal or
hilar adenopathy.

Lungs/Pleura: No pleural fluid. Secretions in the trachea. Minimal
dependent atelectasis at both lung bases.

A left lower lobe pulmonary nodule measures 4 mm on image 99/series
3 and is similar on the prior and back to 09/17/2015, favoring a
benign etiology.

Musculoskeletal: No acute osseous abnormality. Bilateral breast
implants.

CT ABDOMEN PELVIS FINDINGS

Hepatobiliary: Normal liver. Normal gallbladder, without biliary
ductal dilatation.

Pancreas: Normal, without mass or ductal dilatation.

Spleen: Normal in size, without focal abnormality.

Adrenals/Urinary Tract: Normal adrenal glands. Normal right kidney.
left interpolar too small to characterize lesion. Normal urinary
bladder.

Stomach/Bowel: Normal stomach, without wall thickening. Normal colon
and terminal ileum. Normal small bowel.

Vascular/Lymphatic: Age advanced aortic and branch vessel
atherosclerosis. No abdominopelvic adenopathy.

Reproductive: Normal uterus and adnexa.

Other: No significant free fluid. No evidence of omental or
peritoneal disease.

Musculoskeletal: No acute osseous abnormality. Degenerate disc
disease with disc bulge at L3-4.
IMPRESSION: 1. No acute process or evidence of metastatic disease within the
chest, abdomen, or pelvis.
2. Suspicion of age advanced coronary artery atherosclerosis.
Correlate with risk factors and consider medical therapy.
3.  Aortic Atherosclerosis ([id number]).  This is also age advanced
4. Similar left lower lobe pulmonary nodule.
5. Atypical Port-A-Cath position, interval change relative to prior
exams. Consider physical exam correlation.

## 2019-03-03 DEATH — deceased

## 2019-10-17 IMAGING — CT CT CHEST W/ CM
2 of 5 series · 13 of 36 positions shown, 16 images · IV contrast (APPLIED)
Comparison: 09/21/2016

CLINICAL DATA: Right ovarian neoplasm. History of lupus.
Hypertension. Smoker. Appendectomy. Hysterectomy. Oophorectomy.
Worsening pain in right lower abdomen. Disseminated malignant
neoplasm of right ovary.

EXAM:
CT CHEST, ABDOMEN, AND PELVIS WITH CONTRAST
TECHNIQUE: Multidetector CT imaging of the chest, abdomen and pelvis was
performed following the standard protocol during bolus
administration of intravenous contrast.
CONTRAST:  100mL [id number] IOPAMIDOL ([id number]) INJECTION 61%

[Series 2: cap with 2 · axial · 0.73mm/px · z∈[+388,+928]mm · 10 of 130 slices shown, 13 images]
[im 11/130  mediastinal]
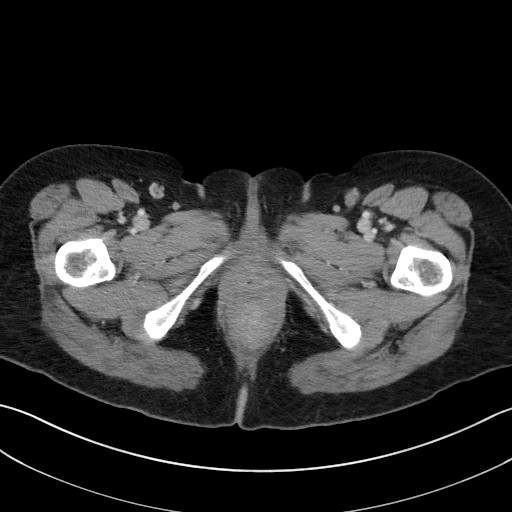
[im 11/130  lung]
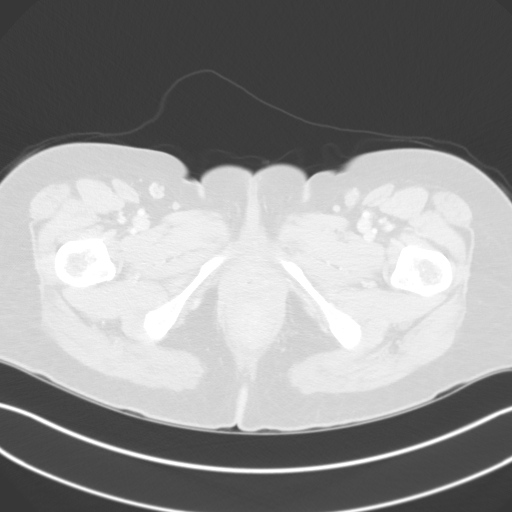
[im 22/130  lung]
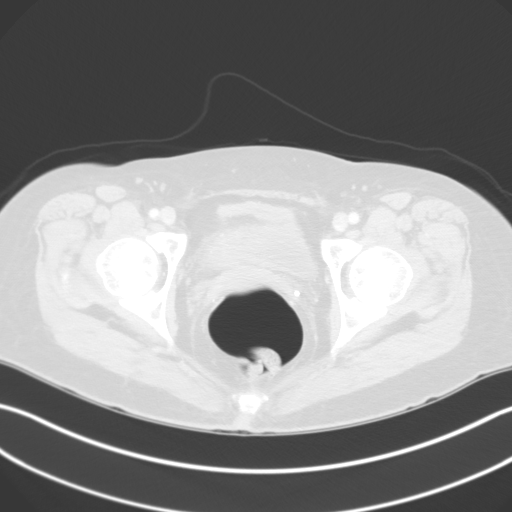
[im 33/130  lung]
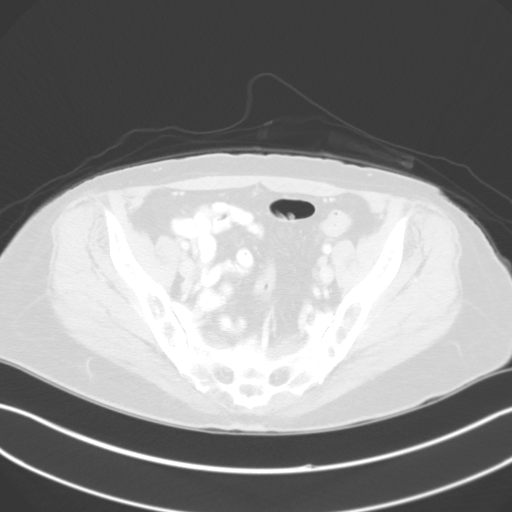
[im 44/130  lung]
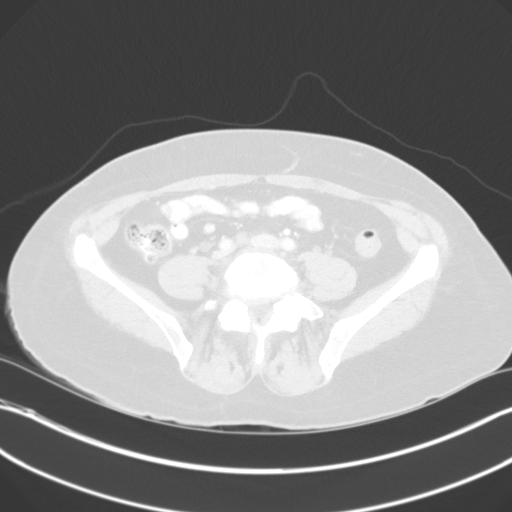
[im 54/130  mediastinal]
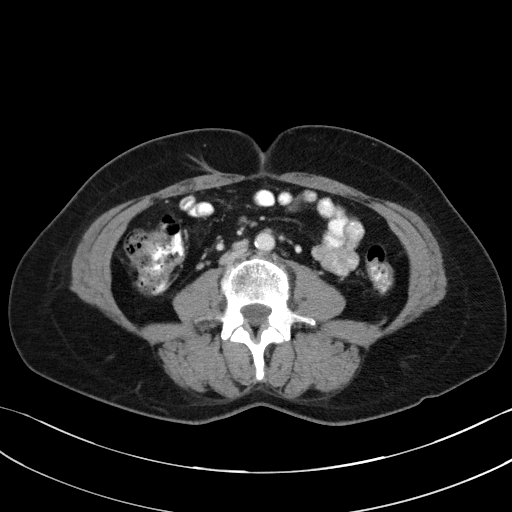
[im 54/130  lung]
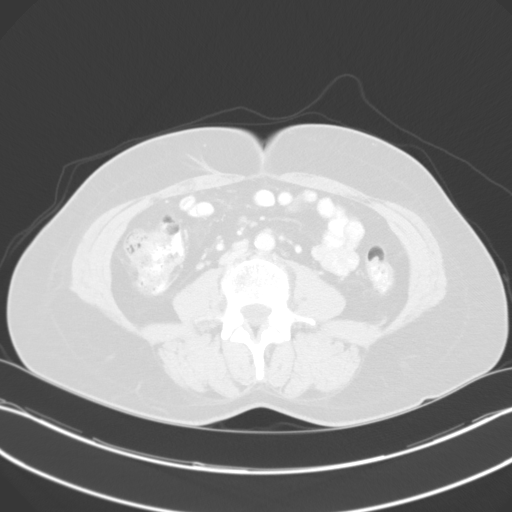
[im 76/130  lung]
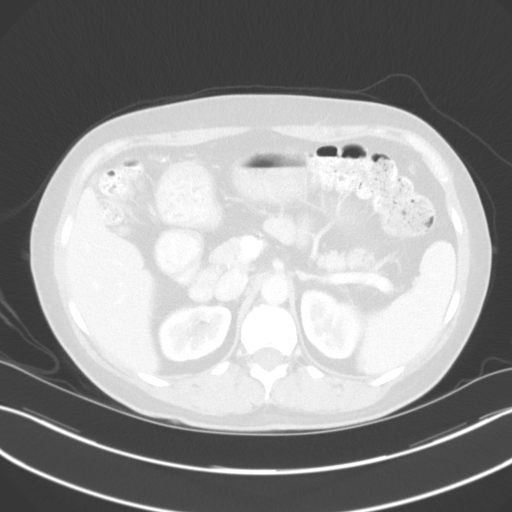
[im 87/130  lung]
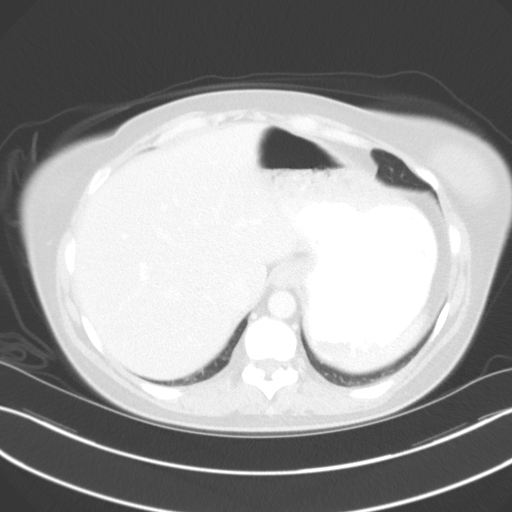
[im 97/130  lung]
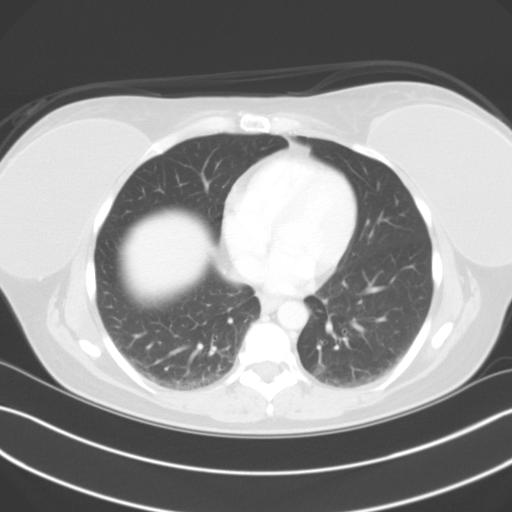
[im 108/130  mediastinal]
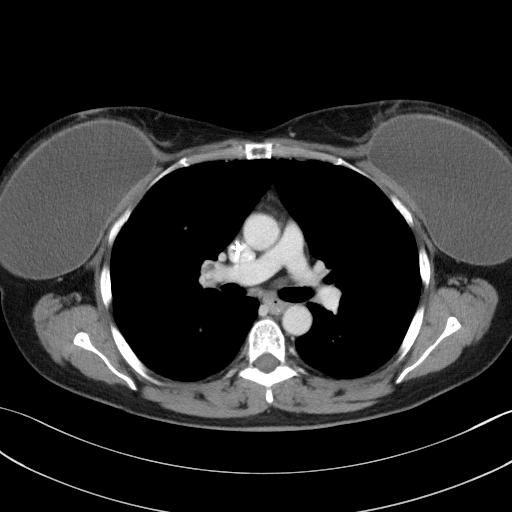
[im 108/130  lung]
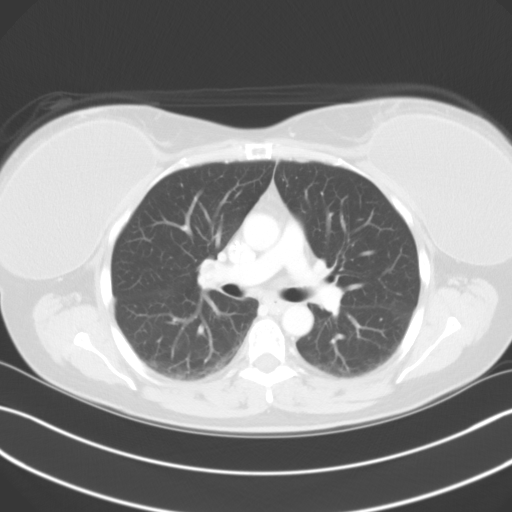
[im 119/130  lung]
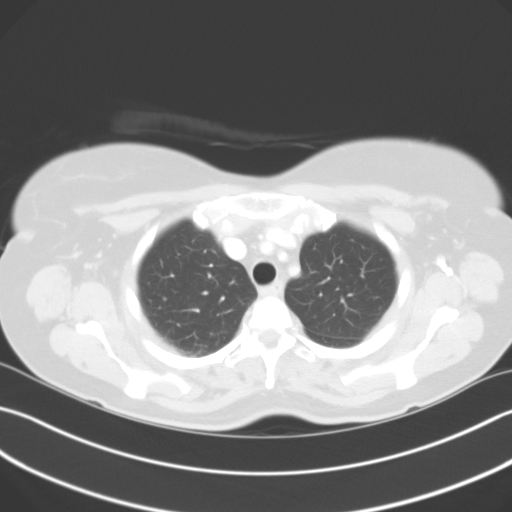

[Series 5: coronals · coronal · 0.77mm/px · 3 of 151 slices shown]
[im 31/151  lung]
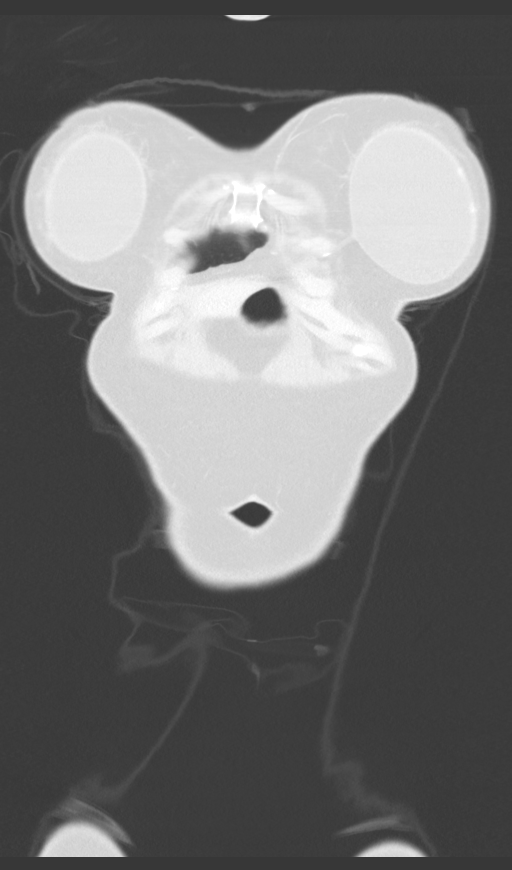
[im 61/151  lung]
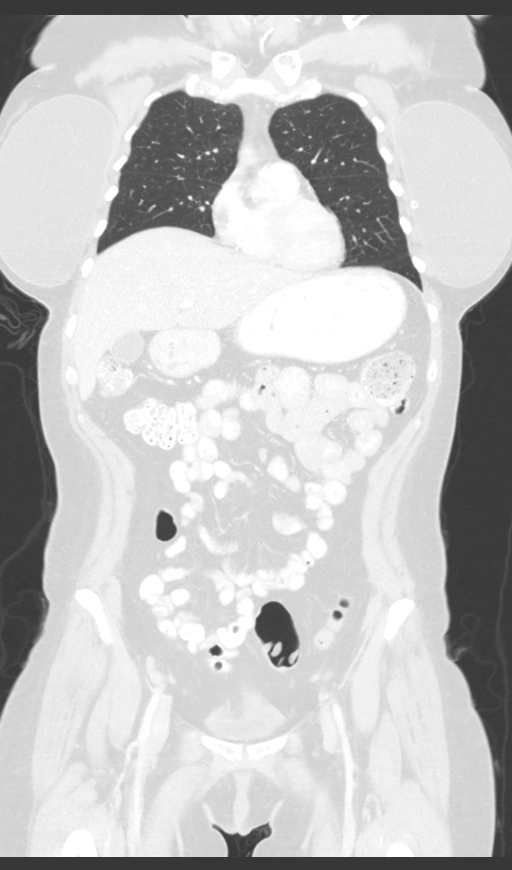
[im 91/151  lung]
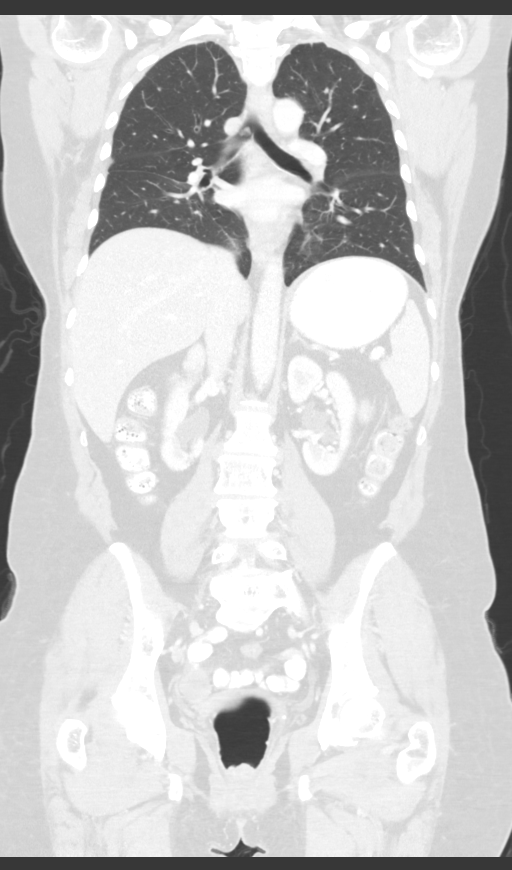

[13 of 36 positions shown; findings below may reference images not displayed]

FINDINGS: CT CHEST FINDINGS

Cardiovascular: Left-sided Port-A-Cath which terminates at the high
right atrium. Aortic and branch vessel atherosclerosis. Normal heart
size, without pericardial effusion. Lad coronary artery
atherosclerosis. No central pulmonary embolism, on this
non-dedicated study.

Mediastinum/Nodes: No supraclavicular adenopathy. No mediastinal or
hilar adenopathy.

Lungs/Pleura: No pleural fluid. Mild centrilobular emphysema.
Minimal bibasilar dependent atelectasis.

Musculoskeletal: No acute osseous abnormality.

CT ABDOMEN PELVIS FINDINGS

Hepatobiliary: Focal steatosis adjacent the falciform ligament.
Normal gallbladder, without biliary ductal dilatation.

Pancreas: Normal, without mass or ductal dilatation.

Spleen: Normal in size, without focal abnormality.

Adrenals/Urinary Tract: Normal adrenal glands. Bilateral extrarenal
pelves. Interpolar left renal too small to characterize lesion. No
hydronephrosis. Normal urinary bladder.

Stomach/Bowel: Normal stomach, without wall thickening. Normal colon
and terminal ileum. Normal small bowel.

Vascular/Lymphatic: Aortic and branch vessel atherosclerosis. No
abdominal adenopathy. Soft tissue density, favored to represent a an
enlarging node, at 9 mm within the right common iliac station on
image 87/2. This is at the site of a 2 mm node on the prior.

Reproductive: Normal uterus. Right ovarian heterogeneous mass
measures 4.3 x 3.4 cm, including on image 104/2.

Other: No significant free fluid. No evidence of omental or
peritoneal disease.

Musculoskeletal: Lumbosacral spondylosis with multilevel mild disc
bulges.
IMPRESSION: 1. Development of a right ovarian/adnexal heterogeneous mass,
suspicious for recurrent/metastatic disease.
2. Right common iliac node, not pathologic by size criteria but
enlarged since the prior. Suspicious for nodal metastasis.
3. Age advanced coronary artery atherosclerosis. Recommend
assessment of coronary risk factors and consideration of medical
therapy.
4. Aortic atherosclerosis ([id number]) and emphysema ([id number]).
